# Patient Record
Sex: Female | Born: 1995 | Race: Black or African American | Hispanic: No | Marital: Single | Smoking: Never smoker
Health system: Southern US, Community
[De-identification: ages and names within clinical notes are randomized; demographics above are authoritative.]

## PROBLEM LIST (undated history)

## (undated) DIAGNOSIS — K5090 Crohn's disease, unspecified, without complications: Secondary | ICD-10-CM

## (undated) HISTORY — PX: COLONOSCOPY: SHX174

---
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
## 2016-04-21 ENCOUNTER — Ambulatory Visit (INDEPENDENT_AMBULATORY_CARE_PROVIDER_SITE_OTHER): Payer: Managed Care, Other (non HMO) | Admitting: Student

## 2016-04-21 DIAGNOSIS — Z111 Encounter for screening for respiratory tuberculosis: Secondary | ICD-10-CM | POA: Diagnosis not present

## 2016-04-21 NOTE — Progress Notes (Addendum)
Subjective:    Patient ID: Ann Avery, female    DOB: 1995-06-29, 21 y.o.   MRN: [id number]  HPI   Tuberculosis Risk Questionnaire  1. No Were you born outside the [place] in one of the following parts of the world: [place], [place], [place], [place] or [place]?    2. No Have you traveled outside the [place] and lived for more than one month in one of the following parts of the world: [place], [place], [place], [place] or [place]?    3. Yes on pentasa for Crohn's Do you have a compromised immune system such as from any of the following conditions:HIV/AIDS, organ or bone marrow transplantation, diabetes, immunosuppressive medicines (e.g. Prednisone, Remicaide), leukemia, lymphoma, cancer of the head or neck, gastrectomy or jejunal bypass, end-stage renal disease (on dialysis), or silicosis?     4. No Have you ever or do you plan on working in: a residential care center, a health care facility, a jail or prison or homeless shelter?    5. No Have you ever: injected illegal drugs, used crack cocaine, lived in a homeless shelter  or been in jail or prison?     6. No Have you ever been exposed to anyone with infectious tuberculosis?    Tuberculosis Symptom Questionnaire  Do you currently have any of the following symptoms?  1. No Unexplained cough lasting more than 3 weeks?   2. No Unexplained fever lasting more than 3 weeks.   3. No Night Sweats (sweating that leaves the bedclothes and sheets wet)     4. No Shortness of Breath   5. No Chest Pain   6. No Unintentional weight loss    7. No Unexplained fatigue (very tired for no reason)    PMHx - Updated and reviewed.  Contributory factors include: Crohn's PSHx - Updated and reviewed.  Contributory factors include:  Negative FHx - Updated and reviewed.  Contributory factors include:  Negative Social Hx - Updated and reviewed. Contributory factors include: Negative Medications - reviewed     Review of Systems  Constitutional: Negative for chills, fatigue and fever.  HENT: Negative for congestion and rhinorrhea.   Respiratory: Negative for cough, chest tightness and shortness of breath.   Cardiovascular: Negative for chest pain and leg swelling.  Gastrointestinal: Negative for abdominal pain and nausea.  Genitourinary: Negative for dysuria and urgency.  Musculoskeletal: Negative for arthralgias and joint swelling.  Skin: Negative for rash and wound.  Psychiatric/Behavioral: Negative for agitation and confusion.  All other systems reviewed and are negative.      Objective:   Physical Exam  Constitutional: She is oriented to person, place, and time. She appears well-developed and well-nourished. No distress.  HENT:  Head: Normocephalic and atraumatic.  Right Ear: External ear normal.  Left Ear: External ear normal.  Neck: Normal range of motion. Neck supple.  Pulmonary/Chest: Effort normal. No respiratory distress.  Musculoskeletal: Normal range of motion. She exhibits no edema.  Neurological: She is alert and oriented to person, place, and time.  Skin: Skin is warm. No rash noted. She is not diaphoretic. No erythema.  Psychiatric: She has a normal mood and affect. Her behavior is normal. Judgment and thought content normal.  Nursing note and vitals reviewed.   BP 110/80 (BP Location: Right Arm, Patient Position: Sitting, Cuff Size: Small)   Pulse 97   Temp 97.7 F (36.5 C) (Oral)   Resp 16   Ht 5' 7"  (1.702 m)  Wt 119 lb (54 kg)   LMP 03/17/2016   SpO2 100%   BMI 18.64 kg/m        Assessment & Plan:  Screening-pulmonary TB Has no risk factors, besides some immunosuppression.  PPD placed, recheck in 48-72 hours.  Signed,  Balinda Quails, DO [facility] 6:37 PM

## 2016-04-21 NOTE — Assessment & Plan Note (Addendum)
Has no risk factors, besides some immunosuppression.  PPD placed, recheck in 48-72 hours.

## 2016-04-21 NOTE — Patient Instructions (Addendum)
Please come 48-72 hours after placement of PPD.  That is Thursday evening or Friday during day.    IF you received an x-ray today, you will receive an invoice from [facility]. Please contact [facility] at [phone] with questions or concerns regarding your invoice.   IF you received labwork today, you will receive an invoice from [facility]. Please contact [facility] at [phone] with questions or concerns regarding your invoice.   Our billing staff will not be able to assist you with questions regarding bills from these companies.  You will be contacted with the lab results as soon as they are available. The fastest way to get your results is to activate your My Chart account. Instructions are located on the last page of this paperwork. If you have not heard from [place] regarding the results in 2 weeks, please contact this office.

## 2016-04-24 ENCOUNTER — Ambulatory Visit: Payer: Managed Care, Other (non HMO)

## 2016-04-24 DIAGNOSIS — Z111 Encounter for screening for respiratory tuberculosis: Secondary | ICD-10-CM

## 2016-04-24 LAB — TB SKIN TEST
Induration: 0 mm
TB Skin Test: NEGATIVE

## 2016-04-24 NOTE — Progress Notes (Unsigned)
PPd Read on left arm.

## 2017-02-23 IMAGING — CT CT ABD-PELV W/ CM
2 of 4 series · 5 of 46 positions shown, 7 images · IV contrast (omnipaque)
Comparison: None.

CLINICAL DATA: Right lower quadrant pain starting yesterday.
Previous history of Crohn's disease in remission for many years.

EXAM:
CT ABDOMEN AND PELVIS WITH CONTRAST
TECHNIQUE: Multidetector CT imaging of the abdomen and pelvis was performed
using the standard protocol following bolus administration of
intravenous contrast.
CONTRAST:  80mL OMNIPAQUE IOHEXOL 300 MG/ML  SOLN

[Series 204: cor · coronal · 0.45mm/px · 4 of 87 slices shown, 5 images]
[im 20/87  soft-tissue]
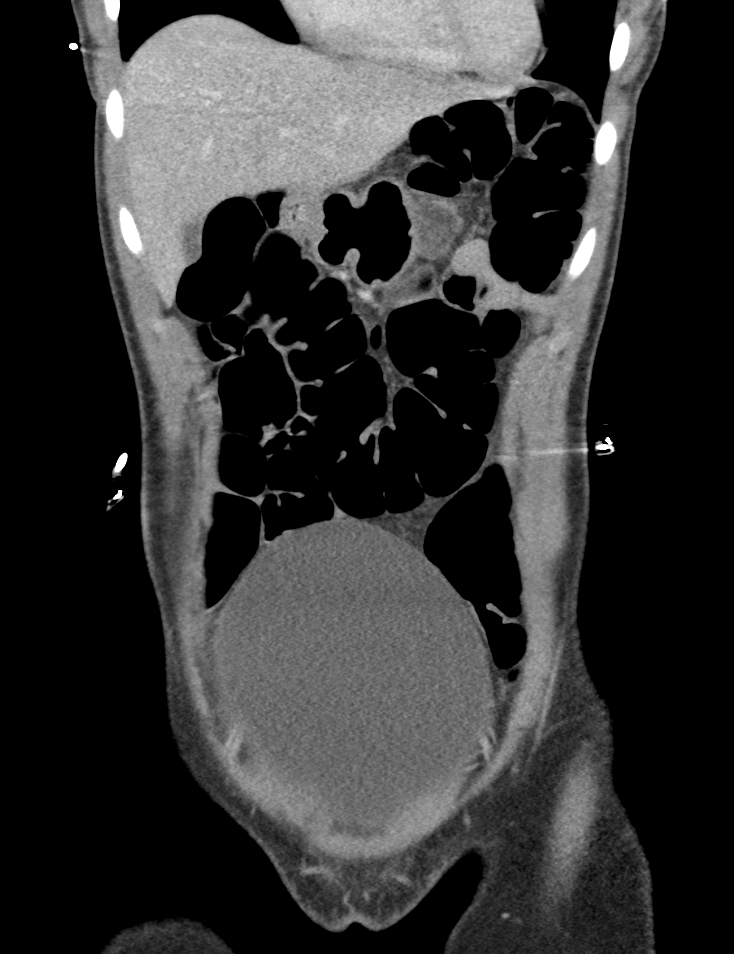
[im 20/87  bone]
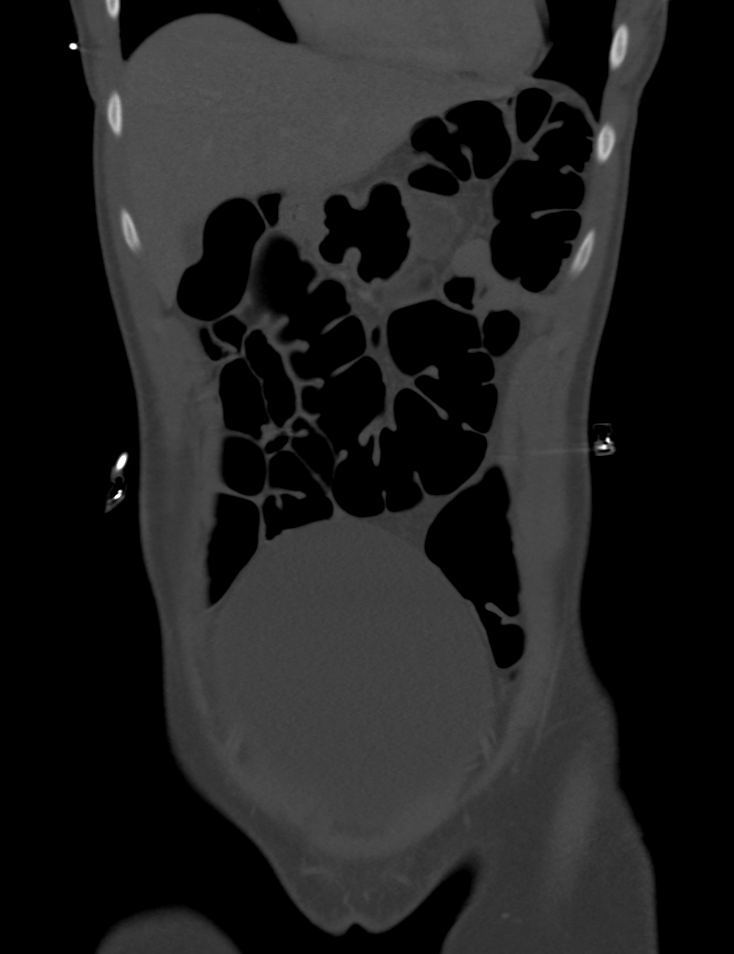
[im 39/87  soft-tissue]
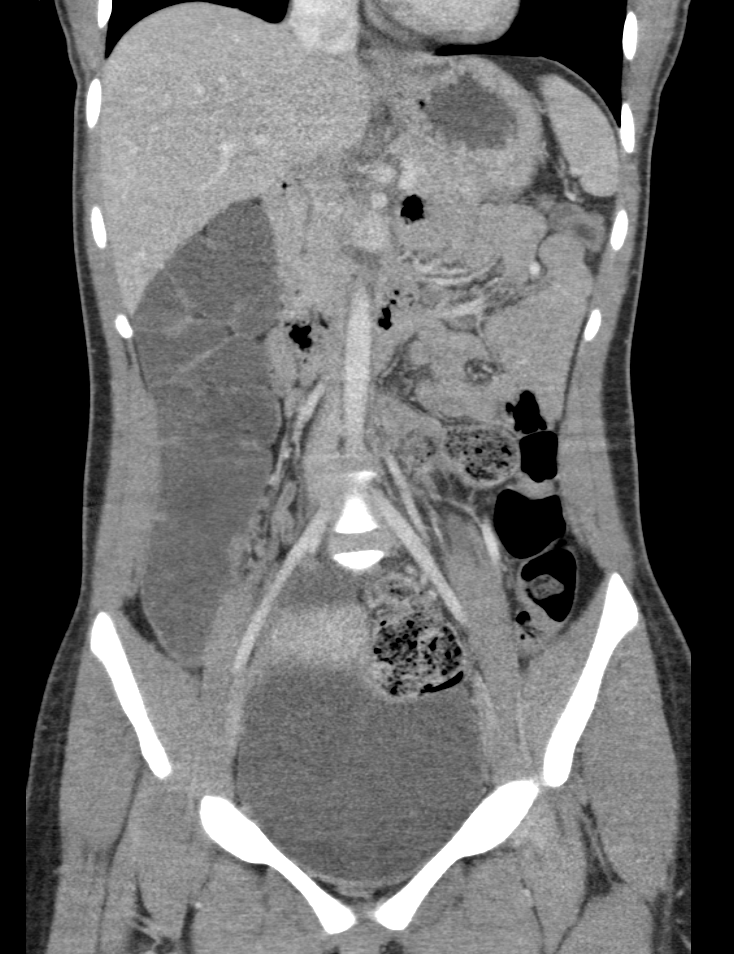
[im 58/87  soft-tissue]
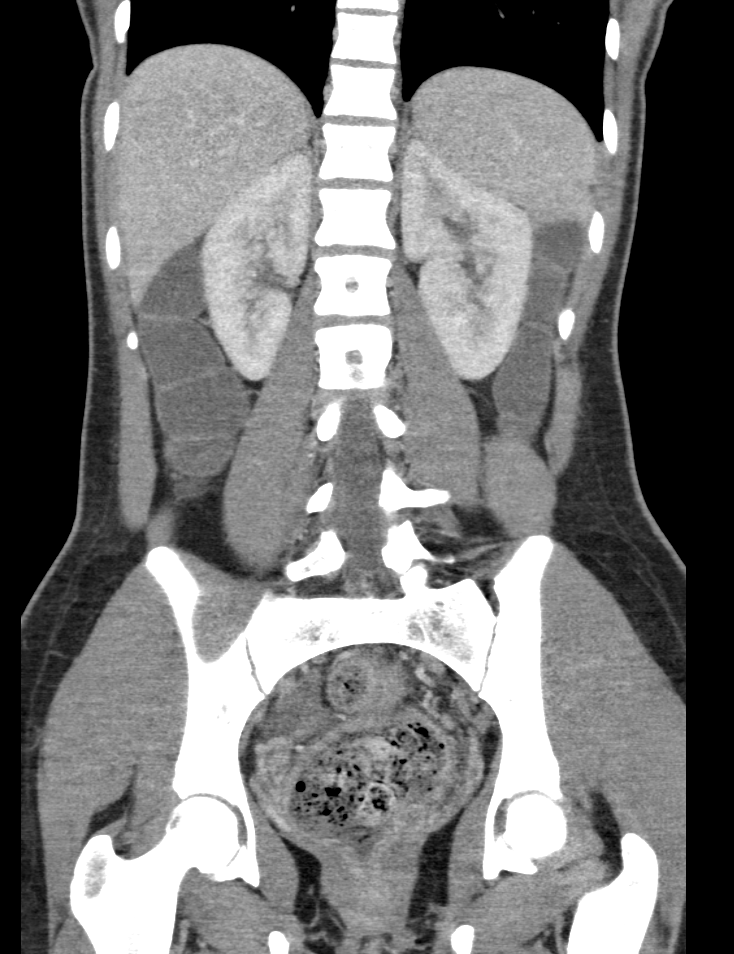
[im 77/87  soft-tissue]
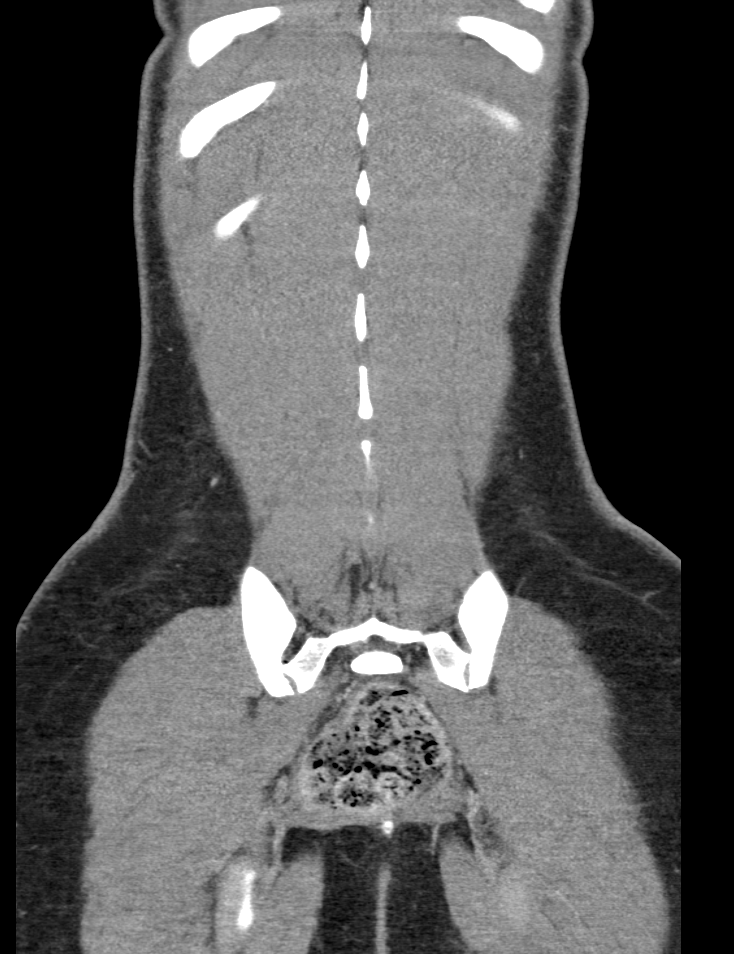

[Series 205: sag · sagittal · 0.45mm/px · 1 of 132 slices shown, 2 images]
[im 44/132  soft-tissue]
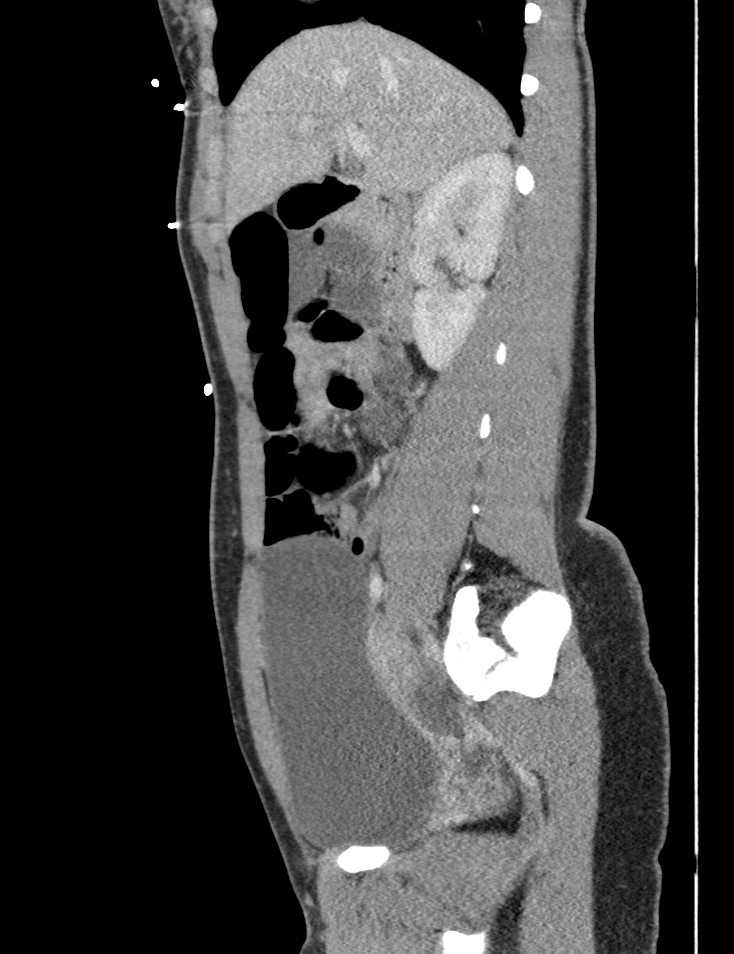
[im 44/132  bone]
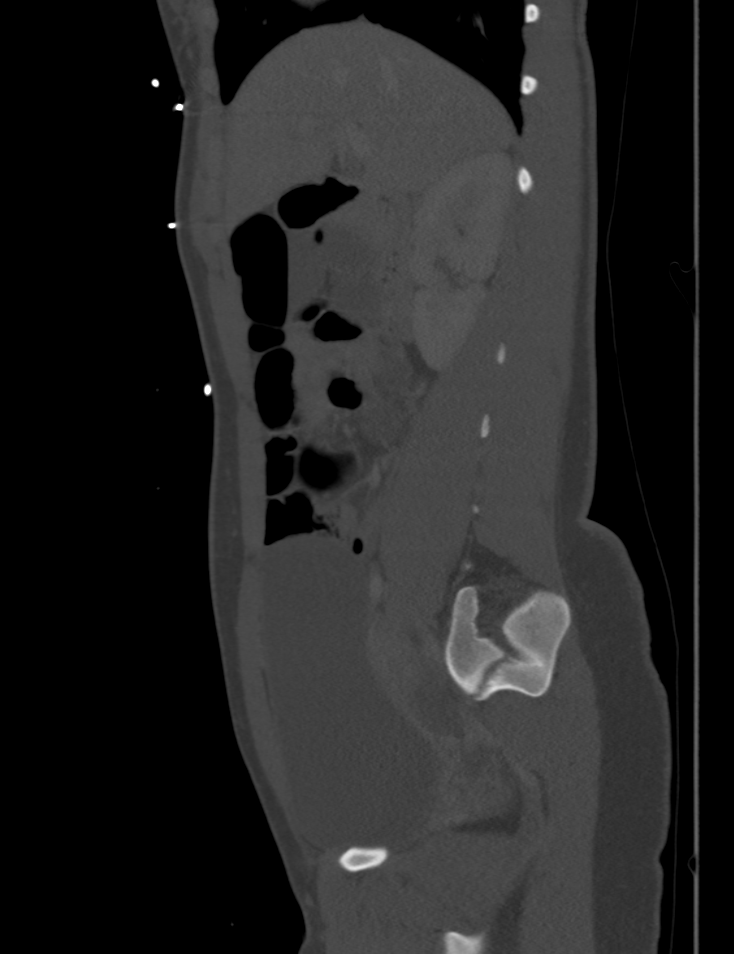

[5 of 46 positions shown; findings below may reference images not displayed]

FINDINGS: The lung bases are clear.

The liver, spleen, gallbladder, pancreas, adrenal glands, kidneys,
abdominal aorta, inferior vena cava, and retroperitoneal lymph nodes
are unremarkable.

Stomach and small bowel are decompressed. Stool-filled rectosigmoid
colon with remainder the colon being gas-filled. Air-fluid levels
are present throughout the colon. No colonic wall thickening.
Changes are nonspecific but suggest infectious colitis. No specific
small bowel wall thickening to suggest evidence of Crohn's disease
although decompression of small bowel limits evaluation. No free air
or free fluid in the abdomen.

Pelvis: The appendix is normal. The bladder is prominently distended
but without wall thickening or stone. Uterus is not enlarged. No
pelvic mass or lymphadenopathy. No free or loculated pelvic fluid
collections. No destructive bone lesions.
IMPRESSION: Multiple air-fluid levels in the colon suggesting colitis. No
specific changes of Crohn's disease. Bladder is diffusely distended
with urine. No bladder wall thickening.

## 2017-03-02 DIAGNOSIS — F909 Attention-deficit hyperactivity disorder, unspecified type: Secondary | ICD-10-CM | POA: Insufficient documentation

## 2017-04-23 ENCOUNTER — Encounter (HOSPITAL_COMMUNITY): Payer: Self-pay | Admitting: Family Medicine

## 2017-04-23 ENCOUNTER — Ambulatory Visit (HOSPITAL_COMMUNITY)
Admission: EM | Admit: 2017-04-23 | Discharge: 2017-04-23 | Disposition: A | Discharge status: Home / Self Care | Payer: Managed Care, Other (non HMO) | Attending: Internal Medicine | Admitting: Internal Medicine

## 2017-04-23 DIAGNOSIS — J00 Acute nasopharyngitis [common cold]: Secondary | ICD-10-CM

## 2017-04-23 DIAGNOSIS — J029 Acute pharyngitis, unspecified: Secondary | ICD-10-CM

## 2017-04-23 DIAGNOSIS — R509 Fever, unspecified: Secondary | ICD-10-CM | POA: Diagnosis not present

## 2017-04-23 LAB — POCT RAPID STREP A: Streptococcus, Group A Screen (Direct): NEGATIVE

## 2017-04-23 MED ORDER — BENZONATATE 100 MG PO CAPS: 100.0000 mg | ORAL_CAPSULE | Freq: Three times a day (TID) | ORAL | 0 refills | Status: DC

## 2017-04-23 MED ORDER — FLUTICASONE PROPIONATE 50 MCG/ACT NA SUSP: 2.0000 | Freq: Every day | NASAL | 0 refills | Status: DC

## 2017-04-23 MED ORDER — IPRATROPIUM BROMIDE 0.06 % NA SOLN: 2.0000 | Freq: Four times a day (QID) | NASAL | 0 refills | Status: DC

## 2017-04-23 MED ORDER — PHENOL 1.4 % MT LIQD: 1.0000 | OROMUCOSAL | 0 refills | Status: DC | PRN

## 2017-04-23 NOTE — ED Triage Notes (Signed)
Pt here for fever, sore throat since yesterday. Drinking tea that doesn't help. Reports recent case of the flu.

## 2017-04-23 NOTE — ED Provider Notes (Signed)
[facility]    CSN: [id number] Arrival date & time: 04/23/17  1851     History   Chief Complaint Chief Complaint  Patient presents with  . Fever  . Sore Throat    HPI Ann Avery is a 22 y.o. female.   22 year old female comes in for a 2 day history of URI symptoms.  Has had sore throat, nasal congestion, productive cough, fever. Tmax 102, has not taken anything for it. Eating and drinking without problems. Never smoker. Had flu like symptoms 2 weeks ago that completely resolved prior to current symptom onset.      Past Medical History:  Diagnosis Date  . Crohn disease ([place])   . Ulcerative colitis     Patient Active Problem List   Diagnosis Date Noted  . Screening-pulmonary TB 04/21/2016    Past Surgical History:  Procedure Laterality Date  . COLONOSCOPY      OB History    No data available       Home Medications    Prior to Admission medications   Medication Sig Start Date End Date Taking? Authorizing Provider  benzonatate (TESSALON) 100 MG capsule Take 1 capsule (100 mg total) by mouth every 8 (eight) hours. 04/23/17   Tasia Catchings, Danaka Llera V, PA-C  fluticasone (FLONASE) 50 MCG/ACT nasal spray Place 2 sprays into both nostrils daily. 04/23/17   Tasia Catchings, Jovontae Banko V, PA-C  guanFACINE (INTUNIV) 4 MG TB24 SR tablet Take 4 mg by mouth daily.    [provider]  ipratropium (ATROVENT) 0.06 % nasal spray Place 2 sprays into both nostrils 4 (four) times daily. 04/23/17   Ok Edwards, PA-C  levonorgestrel-ethinyl estradiol (AVIANE,ALESSE,LESSINA) 0.1-20 MG-MCG tablet Take 1 tablet by mouth daily.    [provider]  mesalamine (PENTASA) 500 MG CR capsule Take 500 mg by mouth 2 (two) times daily.     [provider]  methylphenidate (CONCERTA) 36 MG CR tablet Take 36 mg by mouth every morning.    [provider]  phenol (CHLORASEPTIC) 1.4 % LIQD Use as directed 1 spray in the mouth or throat as needed for throat irritation / pain. 04/23/17   Ok Edwards,  PA-C    Family History Family History  Problem Relation Age of Onset  . Hypertension Mother   . Diabetes Father     Social History Social History   Tobacco Use  . Smoking status: Never Smoker  . Smokeless tobacco: Never Used  Substance Use Topics  . Alcohol use: No  . Drug use: No     Allergies   Latex and Shellfish allergy   Review of Systems Review of Systems  Reason unable to perform ROS: See HPI as above.     Physical Exam Triage Vital Signs ED Triage Vitals [04/23/17 1929]  Enc Vitals Group     BP 111/63     Pulse Rate (!) 104     Resp 18     Temp 99 F (37.2 C)     Temp src      SpO2 100 %     Weight      Height      Head Circumference      Peak Flow      Pain Score 7     Pain Loc      Pain Edu?      Excl. in [place]?    No data found.  Updated Vital Signs BP 111/63   Pulse (!) 104   Temp  99 F (37.2 C)   Resp 18   LMP 04/07/2017   SpO2 100%   Physical Exam  Constitutional: She is oriented to person, place, and time. She appears well-developed and well-nourished. No distress.  HENT:  Head: Normocephalic and atraumatic.  Right Ear: External ear and ear canal normal.  Left Ear: External ear and ear canal normal.  Nose: Mucosal edema and rhinorrhea present. Right sinus exhibits no maxillary sinus tenderness and no frontal sinus tenderness. Left sinus exhibits no maxillary sinus tenderness and no frontal sinus tenderness.  Mouth/Throat: Uvula is midline and mucous membranes are normal. Posterior oropharyngeal erythema present. No tonsillar exudate.  Bilateral cerumen impaction. TM not visible.   Eyes: Conjunctivae are normal. Pupils are equal, round, and reactive to light.  Neck: Normal range of motion. Neck supple.  Cardiovascular: Normal rate, regular rhythm and normal heart sounds. Exam reveals no gallop and no friction rub.  No murmur heard. Pulmonary/Chest: Effort normal and breath sounds normal. She has no decreased breath sounds. She has  no wheezes. She has no rhonchi. She has no rales.  Lymphadenopathy:    She has no cervical adenopathy.  Neurological: She is alert and oriented to person, place, and time.  Skin: Skin is warm and dry.  Psychiatric: She has a normal mood and affect. Her behavior is normal. Judgment normal.    UC Treatments / Results  Labs (all labs ordered are listed, but only abnormal results are displayed) Labs Reviewed  CULTURE, GROUP A STREP [facility])  POCT RAPID STREP A    EKG  EKG Interpretation None       Radiology No results found.  Procedures Procedures (including critical care time)  Medications Ordered in UC Medications - No data to display   Initial Impression / Assessment and Plan / UC Course  I have reviewed the triage vital signs and the nursing notes.  Pertinent labs & imaging results that were available during my care of the patient were reviewed by me and considered in my medical decision making (see chart for details).    Rapid strep negative. Symptomatic treatment as needed. Return precautions given.   Final Clinical Impressions(s) / UC Diagnoses   Final diagnoses:  Acute nasopharyngitis    ED Discharge Orders        Ordered    fluticasone (FLONASE) 50 MCG/ACT nasal spray  Daily     04/23/17 2029    ipratropium (ATROVENT) 0.06 % nasal spray  4 times daily     04/23/17 2029    benzonatate (TESSALON) 100 MG capsule  Every 8 hours     04/23/17 2029    phenol (CHLORASEPTIC) 1.4 % LIQD  As needed     04/23/17 2029        Ok Edwards, PA-C 04/23/17 2036

## 2017-04-23 NOTE — Discharge Instructions (Signed)
Rapid strep negative. Tessalon for cough. Start flonase, atrovent nasal spray, zyrtec-D for nasal congestion/drainage. You can use over the counter nasal saline rinse such as neti pot for nasal congestion. Keep hydrated, your urine should be clear to pale yellow in color. Tylenol/motrin for fever and pain. Monitor for any worsening of symptoms, chest pain, shortness of breath, wheezing, swelling of the throat, follow up for reevaluation.   For sore throat try using a honey-based tea. Use 3 teaspoons of honey with juice squeezed from half lemon. Place shaved pieces of ginger into 1/2-1 cup of water and warm over stove top. Then mix the ingredients and repeat every 4 hours as needed.

## 2017-04-26 LAB — CULTURE, GROUP A STREP (THRC)

## 2017-05-23 ENCOUNTER — Encounter (HOSPITAL_COMMUNITY): Payer: Self-pay | Admitting: Emergency Medicine

## 2017-05-23 ENCOUNTER — Other Ambulatory Visit: Payer: Self-pay

## 2017-05-23 DIAGNOSIS — K5090 Crohn's disease, unspecified, without complications: Secondary | ICD-10-CM | POA: Insufficient documentation

## 2017-05-23 DIAGNOSIS — Z91040 Latex allergy status: Secondary | ICD-10-CM | POA: Insufficient documentation

## 2017-05-23 DIAGNOSIS — Z79899 Other long term (current) drug therapy: Secondary | ICD-10-CM | POA: Diagnosis not present

## 2017-05-23 DIAGNOSIS — R197 Diarrhea, unspecified: Secondary | ICD-10-CM | POA: Diagnosis present

## 2017-05-23 LAB — CBC
HEMATOCRIT: 37.2 % (ref 36.0–46.0)
HEMOGLOBIN: 12.1 g/dL (ref 12.0–15.0)
MCH: 29.2 pg (ref 26.0–34.0)
MCHC: 32.5 g/dL (ref 30.0–36.0)
MCV: 89.6 fL (ref 78.0–100.0)
Platelets: 358 10*3/uL (ref 150–400)
RBC: 4.15 MIL/uL (ref 3.87–5.11)
RDW: 13.9 % (ref 11.5–15.5)
WBC: 5.8 10*3/uL (ref 4.0–10.5)

## 2017-05-23 LAB — COMPREHENSIVE METABOLIC PANEL
ALT: 13 U/L — ABNORMAL LOW (ref 14–54)
ANION GAP: 11 (ref 5–15)
AST: 23 U/L (ref 15–41)
Albumin: 3.8 g/dL (ref 3.5–5.0)
Alkaline Phosphatase: 61 U/L (ref 38–126)
BILIRUBIN TOTAL: 0.6 mg/dL (ref 0.3–1.2)
BUN: 6 mg/dL (ref 6–20)
CHLORIDE: 105 mmol/L (ref 101–111)
CO2: 20 mmol/L — ABNORMAL LOW (ref 22–32)
Calcium: 8.9 mg/dL (ref 8.9–10.3)
Creatinine, Ser: 0.89 mg/dL (ref 0.44–1.00)
Glucose, Bld: 90 mg/dL (ref 65–99)
POTASSIUM: 3.3 mmol/L — AB (ref 3.5–5.1)
Sodium: 136 mmol/L (ref 135–145)
TOTAL PROTEIN: 7.8 g/dL (ref 6.5–8.1)

## 2017-05-23 LAB — LIPASE, BLOOD: LIPASE: 32 U/L (ref 11–51)

## 2017-05-23 LAB — URINALYSIS, ROUTINE W REFLEX MICROSCOPIC
BILIRUBIN URINE: NEGATIVE
Glucose, UA: NEGATIVE mg/dL
HGB URINE DIPSTICK: NEGATIVE
KETONES UR: 20 mg/dL — AB
LEUKOCYTES UA: NEGATIVE
NITRITE: NEGATIVE
Protein, ur: 30 mg/dL — AB
SPECIFIC GRAVITY, URINE: 1.028 (ref 1.005–1.030)
pH: 5 (ref 5.0–8.0)

## 2017-05-23 LAB — I-STAT BETA HCG BLOOD, ED (MC, WL, AP ONLY)

## 2017-05-23 NOTE — ED Triage Notes (Signed)
Pt presents with chrons flare this morning with increased diarrhea and weight loss and decreased appetite; pt denies bloody stools

## 2017-05-24 ENCOUNTER — Emergency Department (HOSPITAL_COMMUNITY)
Admission: EM | Admit: 2017-05-24 | Discharge: 2017-05-24 | Disposition: A | Discharge status: Home / Self Care | Payer: Managed Care, Other (non HMO) | Attending: Emergency Medicine | Admitting: Emergency Medicine

## 2017-05-24 DIAGNOSIS — K5010 Crohn's disease of large intestine without complications: Secondary | ICD-10-CM

## 2017-05-24 DIAGNOSIS — R197 Diarrhea, unspecified: Secondary | ICD-10-CM

## 2017-05-24 DIAGNOSIS — K5090 Crohn's disease, unspecified, without complications: Secondary | ICD-10-CM | POA: Diagnosis not present

## 2017-05-24 MED ORDER — ONDANSETRON 4 MG PO TBDP
4.0000 mg | ORAL_TABLET | Freq: Once | ORAL | Status: AC | PRN
  Administered 2017-05-24: 4 mg via ORAL
  Filled 2017-05-24: qty 1

## 2017-05-24 MED ORDER — SODIUM CHLORIDE 0.9 % IV BOLUS
2000.0000 mL | Freq: Once | INTRAVENOUS | Status: AC
  Administered 2017-05-24: 2000 mL via INTRAVENOUS

## 2017-05-24 MED ORDER — METHYLPREDNISOLONE SODIUM SUCC 125 MG IJ SOLR
125.0000 mg | Freq: Once | INTRAMUSCULAR | Status: AC
  Administered 2017-05-24: 125 mg via INTRAVENOUS
  Filled 2017-05-24: qty 2

## 2017-05-24 MED ORDER — PREDNISONE 10 MG PO TABS: ORAL_TABLET | ORAL | 0 refills | Status: DC

## 2017-05-24 MED ORDER — ONDANSETRON 4 MG PO TBDP: 4.0000 mg | ORAL_TABLET | Freq: Three times a day (TID) | ORAL | 0 refills | Status: DC | PRN

## 2017-05-24 NOTE — ED Notes (Signed)
Pt ambulated without assistance around POD C. No c/o of dizziness or light headedness

## 2017-05-24 NOTE — ED Provider Notes (Signed)
[facility] EMERGENCY DEPARTMENT Provider Note   CSN: [id number] Arrival date & time: 05/23/17  2114     History   Chief Complaint Chief Complaint  Patient presents with  . Diarrhea  . Weight Loss    HPI Ann Avery is a 22 y.o. female.  Patient with a history of Crohn's Disease, followed by Dr. Watt Climes, having symptoms of flare including 1 day of nonbloody diarrhea, diffuse abdominal cramping without fever. She reports nausea with only one episode vomiting. Nausea is resolved with zofran given after arrival. She reports the last Crohn's flare was about 2 years ago. No urinary symptoms, cough, SOB, URI symptoms.    The history is provided by the patient and a parent. No language interpreter was used.  Diarrhea   Associated symptoms include abdominal pain and vomiting. Pertinent negatives include no chills and no myalgias.    Past Medical History:  Diagnosis Date  . Crohn disease ([place])   . Ulcerative colitis     Patient Active Problem List   Diagnosis Date Noted  . Screening-pulmonary TB 04/21/2016    Past Surgical History:  Procedure Laterality Date  . COLONOSCOPY       OB History   None      Home Medications    Prior to Admission medications   Medication Sig Start Date End Date Taking? Authorizing Provider  benzonatate (TESSALON) 100 MG capsule Take 1 capsule (100 mg total) by mouth every 8 (eight) hours. 04/23/17   Tasia Catchings, Amy V, PA-C  fluticasone (FLONASE) 50 MCG/ACT nasal spray Place 2 sprays into both nostrils daily. 04/23/17   Tasia Catchings, Amy V, PA-C  guanFACINE (INTUNIV) 4 MG TB24 SR tablet Take 4 mg by mouth daily.    [provider]  ipratropium (ATROVENT) 0.06 % nasal spray Place 2 sprays into both nostrils 4 (four) times daily. 04/23/17   Ok Edwards, PA-C  levonorgestrel-ethinyl estradiol (AVIANE,ALESSE,LESSINA) 0.1-20 MG-MCG tablet Take 1 tablet by mouth daily.    [provider]  mesalamine (PENTASA) 500 MG CR capsule Take 500  mg by mouth 2 (two) times daily.     [provider]  methylphenidate (CONCERTA) 36 MG CR tablet Take 36 mg by mouth every morning.    [provider]  phenol (CHLORASEPTIC) 1.4 % LIQD Use as directed 1 spray in the mouth or throat as needed for throat irritation / pain. 04/23/17   Ok Edwards, PA-C    Family History Family History  Problem Relation Age of Onset  . Hypertension Mother   . Diabetes Father     Social History Social History   Tobacco Use  . Smoking status: Never Smoker  . Smokeless tobacco: Never Used  Substance Use Topics  . Alcohol use: No  . Drug use: No     Allergies   Latex and Shellfish allergy   Review of Systems Review of Systems  Constitutional: Negative for chills and fever.  HENT: Negative.   Respiratory: Negative.   Cardiovascular: Negative.   Gastrointestinal: Positive for abdominal pain, diarrhea, nausea and vomiting.  Musculoskeletal: Negative.  Negative for myalgias.  Skin: Negative.   Neurological: Negative.  Negative for syncope and weakness.     Physical Exam Updated Vital Signs BP 104/62 (BP Location: Right Arm)   Pulse (!) 111   Temp 98.5 F (36.9 C) (Oral)   Resp 18   Ht 5' 7"  (1.702 m)   Wt 53.5 kg (118 lb)   LMP 05/03/2017   SpO2  98%   BMI 18.48 kg/m   Physical Exam  Constitutional: She is oriented to person, place, and time. She appears well-developed and well-nourished.  HENT:  Head: Normocephalic.  Neck: Normal range of motion. Neck supple.  Cardiovascular: Normal rate and regular rhythm.  Pulmonary/Chest: Effort normal and breath sounds normal. She has no wheezes. She has no rales.  Abdominal: Soft. Bowel sounds are normal. She exhibits no distension. There is tenderness (Diffuse). There is no rebound and no guarding.  Musculoskeletal: Normal range of motion.  Neurological: She is alert and oriented to person, place, and time.  Skin: Skin is warm and dry. No rash noted.  Psychiatric: She has a  normal mood and affect.     ED Treatments / Results  Labs (all labs ordered are listed, but only abnormal results are displayed) Labs Reviewed  COMPREHENSIVE METABOLIC PANEL - Abnormal; Notable for the following components:      Result Value   Potassium 3.3 (*)    CO2 20 (*)    ALT 13 (*)    All other components within normal limits  URINALYSIS, ROUTINE W REFLEX MICROSCOPIC - Abnormal; Notable for the following components:   APPearance HAZY (*)    Ketones, ur 20 (*)    Protein, ur 30 (*)    Bacteria, UA RARE (*)    Squamous Epithelial / LPF 6-30 (*)    All other components within normal limits  LIPASE, BLOOD  CBC  I-STAT BETA HCG BLOOD, ED ([facility], [facility], [facility] ONLY)    EKG None  Radiology No results found.  Procedures Procedures (including critical care time)  Medications Ordered in ED Medications  ondansetron (ZOFRAN-ODT) disintegrating tablet 4 mg (4 mg Oral Given 05/24/17 0214)     Initial Impression / Assessment and Plan / ED Course  I have reviewed the triage vital signs and the nursing notes.  Pertinent labs & imaging results that were available during my care of the patient were reviewed by me and considered in my medical decision making (see chart for details).     Patient with h/o Crohn's with symptoms familiar to her as a flare. Followed by Dr. Watt Climes. No fever.   She is receiving IVF's and has gotten Zofran. She is feeling better over time. Discussed steroids given some question of not using them in the past and patient reports she feels they would be of benefit based on how she feels. IV Solumedrol provided.   Plan: anticipate discharge home. Will get rehydrated, start on prednisone, give Zofran, f/u with [facility].   Patient care signed out to [facility], PA-C, pending re-evaluation.   Final Clinical Impressions(s) / ED Diagnoses   Final diagnoses:  None   1. Diarrhea 2. H/o Crohn's Disease  ED Discharge Orders    None       Charlann Lange,  PA-C 05/25/17 0356    Veryl Speak, MD 05/25/17 [phone]

## 2017-05-24 NOTE — ED Notes (Signed)
Updated on wait for treatment room.

## 2017-05-24 NOTE — ED Provider Notes (Signed)
Care assumed from Ann Park PA-C at shift change, but in brief Ann Avery is a 22 y.o. female with Hx of Crohn's, typical flare symptoms, nonbloody diarrhea, diffuse abdominal pain, migrating, mild nausea. Lab work is reassuring, and abdominal exam non indicative of acute surgical abdomen and pt and family would like to avoid imaging.    Plan: Re-eval after 2nd fluid bolus, ambulate, if feeling better, D/C with steroids, zofran and follow up with Dr. Watt Climes.  7:50 AM Pt reports she is feeling better, tolerating PO here in ED and ambulatory without difficulty, pt stable for discharge home with Zofran and course of steroids. She will follow up outpatient with GI this week. Return precautions discussed.    Ann Larsen, PA-C 05/24/17 Ann Avery    Ann Muskrat, MD 05/24/17 [phone]

## 2017-05-24 NOTE — Discharge Instructions (Addendum)
Call Dr. Watt Climes later this morning to schedule a recheck this week. Return here with any high fever, severe pain, bloody diarrhea, uncontrolled vomiting, or for new concerns.

## 2018-04-19 DIAGNOSIS — F909 Attention-deficit hyperactivity disorder, unspecified type: Secondary | ICD-10-CM | POA: Insufficient documentation

## 2018-04-19 DIAGNOSIS — F900 Attention-deficit hyperactivity disorder, predominantly inattentive type: Secondary | ICD-10-CM | POA: Insufficient documentation

## 2018-04-20 ENCOUNTER — Ambulatory Visit
Admission: EM | Admit: 2018-04-20 | Discharge: 2018-04-20 | Disposition: A | Discharge status: Home / Self Care | Payer: Managed Care, Other (non HMO) | Attending: Emergency Medicine | Admitting: Emergency Medicine

## 2018-04-20 DIAGNOSIS — J069 Acute upper respiratory infection, unspecified: Secondary | ICD-10-CM

## 2018-04-20 MED ORDER — BENZONATATE 100 MG PO CAPS: 100.0000 mg | ORAL_CAPSULE | Freq: Three times a day (TID) | ORAL | 0 refills | Status: DC | PRN

## 2018-04-20 MED ORDER — SALINE SPRAY 0.65 % NA SOLN: 1.0000 | NASAL | 0 refills | Status: DC | PRN

## 2018-04-20 MED ORDER — GUAIFENESIN ER 600 MG PO TB12: 1200.0000 mg | ORAL_TABLET | Freq: Two times a day (BID) | ORAL | 0 refills | Status: AC | PRN

## 2018-04-20 NOTE — Discharge Instructions (Signed)
Push fluids to ensure adequate hydration and keep secretions thin.  Tylenol and/or ibuprofen as needed for pain or fevers.  Tessalon as needed for cough.  Mucinex to loosen secretions.  Saline nasal spray to help prevent nose bleeds.  If symptoms worsen or do not improve in the next week to return to be seen or to follow up with your PCP.

## 2018-04-20 NOTE — ED Triage Notes (Signed)
Pt c/o cough, fever, runny nose and body aches x2 days

## 2018-04-20 NOTE — ED Provider Notes (Signed)
[facility]    CSN: [id number] Arrival date & time: 04/20/18  1635     History   Chief Complaint Chief Complaint  Patient presents with  . Influenza    HPI Ann Avery is a 23 y.o. female.   Ann Avery presents with complaints of cough, congestion, body aches, tmax of 100.2, which started two days ago. States had nose bleed this morning. No sore throat, no ear pain. Didn't get a flu vaccine this season. No rash. No gi/gu complaints. No chest pain , no shortness of breath. Took tylenol cold/flu this am which didn't help. Hasn't taken anything since. No gi/gu complaints. Hx of crohns, UC.    ROS per HPI.      Past Medical History:  Diagnosis Date  . Crohn disease ([place])   . Ulcerative colitis     Patient Active Problem List   Diagnosis Date Noted  . Screening-pulmonary TB 04/21/2016    Past Surgical History:  Procedure Laterality Date  . COLONOSCOPY      OB History   No obstetric history on file.      Home Medications    Prior to Admission medications   Medication Sig Start Date End Date Taking? Authorizing Provider  benzonatate (TESSALON) 100 MG capsule Take 1 capsule (100 mg total) by mouth 3 (three) times daily as needed for cough. 04/20/18   Zigmund Gottron, NP  guaiFENesin (MUCINEX) 600 MG 12 hr tablet Take 2 tablets (1,200 mg total) by mouth 2 (two) times daily as needed for up to 5 days. 04/20/18 04/25/18  Zigmund Gottron, NP  guanFACINE (INTUNIV) 4 MG TB24 SR tablet Take 4 mg by mouth daily.    [provider]  levonorgestrel-ethinyl estradiol (AVIANE,ALESSE,LESSINA) 0.1-20 MG-MCG tablet Take 1 tablet by mouth daily.    [provider]  mesalamine (PENTASA) 500 MG CR capsule Take 500 mg by mouth 2 (two) times daily.     [provider]  methylphenidate (CONCERTA) 36 MG CR tablet Take 36 mg by mouth every morning.    [provider]  ondansetron (ZOFRAN ODT) 4 MG disintegrating tablet Take 1 tablet (4 mg  total) by mouth every 8 (eight) hours as needed for nausea or vomiting. 05/24/17   Charlann Lange, PA-C  phenol (CHLORASEPTIC) 1.4 % LIQD Use as directed 1 spray in the mouth or throat as needed for throat irritation / pain. 04/23/17   Tasia Catchings, Amy V, PA-C  sodium chloride (OCEAN) 0.65 % SOLN nasal spray Place 1 spray into both nostrils as needed for congestion. 04/20/18   Zigmund Gottron, NP    Family History Family History  Problem Relation Age of Onset  . Hypertension Mother   . Diabetes Father     Social History Social History   Tobacco Use  . Smoking status: Never Smoker  . Smokeless tobacco: Never Used  Substance Use Topics  . Alcohol use: No  . Drug use: No     Allergies   Latex and Shellfish allergy   Review of Systems Review of Systems   Physical Exam Triage Vital Signs ED Triage Vitals  Enc Vitals Group     BP 04/20/18 1643 107/75     Pulse Rate 04/20/18 1643 (!) 101     Resp 04/20/18 1643 16     Temp 04/20/18 1643 97.7 F (36.5 C)     Temp Source 04/20/18 1643 Oral     SpO2 04/20/18 1643 96 %     Weight --  Height --      Head Circumference --      Peak Flow --      Pain Score 04/20/18 1644 7     Pain Loc --      Pain Edu? --      Excl. in [place]? --    No data found.  Updated Vital Signs BP 107/75 (BP Location: Left Arm)   Pulse (!) 101   Temp 97.7 F (36.5 C) (Oral)   Resp 16   LMP 04/19/2018   SpO2 96%    Physical Exam Constitutional:      General: She is not in acute distress.    Appearance: She is well-developed.  HENT:     Head: Normocephalic and atraumatic.     Right Ear: Tympanic membrane, ear canal and external ear normal.     Left Ear: Tympanic membrane, ear canal and external ear normal.     Nose: Rhinorrhea present.     Mouth/Throat:     Pharynx: Uvula midline.     Tonsils: No tonsillar exudate.  Eyes:     Conjunctiva/sclera: Conjunctivae normal.     Pupils: Pupils are equal, round, and reactive to light.  Cardiovascular:      Rate and Rhythm: Normal rate and regular rhythm.     Heart sounds: Normal heart sounds.  Pulmonary:     Effort: Pulmonary effort is normal.     Breath sounds: Normal breath sounds.  Skin:    General: Skin is warm and dry.  Neurological:     Mental Status: She is alert and oriented to person, place, and time.      UC Treatments / Results  Labs (all labs ordered are listed, but only abnormal results are displayed) Labs Reviewed - No data to display  EKG None  Radiology No results found.  Procedures Procedures (including critical care time)  Medications Ordered in UC Medications - No data to display  Initial Impression / Assessment and Plan / UC Course  I have reviewed the triage vital signs and the nursing notes.  Pertinent labs & imaging results that were available during my care of the patient were reviewed by me and considered in my medical decision making (see chart for details).     Non toxic, afebrile. Benign physical exam. History and physical consistent with viral illness.  Supportive cares recommended. If symptoms worsen or do not improve in the next week to return to be seen or to follow up with PCP.  Patient verbalized understanding and agreeable to plan.    Final Clinical Impressions(s) / UC Diagnoses   Final diagnoses:  Acute upper respiratory infection     Discharge Instructions     Push fluids to ensure adequate hydration and keep secretions thin.  Tylenol and/or ibuprofen as needed for pain or fevers.  Tessalon as needed for cough.  Mucinex to loosen secretions.  Saline nasal spray to help prevent nose bleeds.  If symptoms worsen or do not improve in the next week to return to be seen or to follow up with your PCP.     ED Prescriptions    Medication Sig Dispense Auth. Provider   benzonatate (TESSALON) 100 MG capsule Take 1 capsule (100 mg total) by mouth 3 (three) times daily as needed for cough. 20 capsule Augusto Gamble B, NP   guaiFENesin  (MUCINEX) 600 MG 12 hr tablet Take 2 tablets (1,200 mg total) by mouth 2 (two) times daily as needed for up to 5 days.  20 tablet Augusto Gamble B, NP   sodium chloride (OCEAN) 0.65 % SOLN nasal spray Place 1 spray into both nostrils as needed for congestion. 60 mL Augusto Gamble B, NP     Controlled Substance Prescriptions [place] Controlled Substance Registry consulted? Not Applicable   Zigmund Gottron, NP 04/20/18 [phone]

## 2019-11-01 DIAGNOSIS — Z3400 Encounter for supervision of normal first pregnancy, unspecified trimester: Secondary | ICD-10-CM | POA: Insufficient documentation

## 2019-11-21 DIAGNOSIS — Z3A21 21 weeks gestation of pregnancy: Secondary | ICD-10-CM | POA: Insufficient documentation

## 2019-11-29 DIAGNOSIS — Z91013 Allergy to seafood: Secondary | ICD-10-CM | POA: Insufficient documentation

## 2019-11-29 DIAGNOSIS — L304 Erythema intertrigo: Secondary | ICD-10-CM | POA: Insufficient documentation

## 2019-11-29 DIAGNOSIS — L2082 Flexural eczema: Secondary | ICD-10-CM | POA: Insufficient documentation

## 2020-03-31 DIAGNOSIS — F410 Panic disorder [episodic paroxysmal anxiety] without agoraphobia: Secondary | ICD-10-CM | POA: Insufficient documentation

## 2020-03-31 DIAGNOSIS — Z98891 History of uterine scar from previous surgery: Secondary | ICD-10-CM | POA: Insufficient documentation

## 2021-06-02 ENCOUNTER — Ambulatory Visit
Admission: EM | Admit: 2021-06-02 | Discharge: 2021-06-02 | Disposition: A | Discharge status: Home / Self Care | Payer: Managed Care, Other (non HMO)

## 2021-06-02 DIAGNOSIS — H6123 Impacted cerumen, bilateral: Secondary | ICD-10-CM

## 2021-06-02 DIAGNOSIS — J069 Acute upper respiratory infection, unspecified: Secondary | ICD-10-CM

## 2021-06-02 NOTE — Discharge Instructions (Signed)
?  Try wax softening drops you can get OTC. Follow up with any further concerns.  ?

## 2021-06-02 NOTE — ED Provider Notes (Signed)
?[place] ? ? ? ?CSN: [id number] ?Arrival date & time: 06/02/21  1808 ? ? ?  ? ?History   ?Chief Complaint ?Chief Complaint  ?Patient presents with  ? Otalgia  ? ? ?HPI ?Ann Avery is a 26 y.o. female.  ? ?Patient here today for evaluation of bilateral ear pain that started yesterday. She has had some mild congestion but no other symptoms. She does report some muffled hearing. She has not had fever. She has been using flonase.  ? ?The history is provided by the patient.  ? ?Past Medical History:  ?Diagnosis Date  ? Crohn disease ([place])   ? Ulcerative colitis   ? ? ?Patient Active Problem List  ? Diagnosis Date Noted  ? Screening-pulmonary TB 04/21/2016  ? ? ?Past Surgical History:  ?Procedure Laterality Date  ? COLONOSCOPY    ? ? ?OB History   ?No obstetric history on file. ?  ? ? ? ?Home Medications   ? ?Prior to Admission medications   ?Medication Sig Start Date End Date Taking? Authorizing Provider  ?benzonatate (TESSALON) 100 MG capsule Take 1 capsule (100 mg total) by mouth 3 (three) times daily as needed for cough. 04/20/18   Zigmund Gottron, NP  ?guanFACINE (INTUNIV) 4 MG TB24 SR tablet Take 4 mg by mouth daily.    [provider]  ?levonorgestrel-ethinyl estradiol (AVIANE,ALESSE,LESSINA) 0.1-20 MG-MCG tablet Take 1 tablet by mouth daily.    [provider]  ?mesalamine (PENTASA) 500 MG CR capsule Take 500 mg by mouth 2 (two) times daily.     [provider]  ?methylphenidate (CONCERTA) 36 MG CR tablet Take 36 mg by mouth every morning.    [provider]  ?ondansetron (ZOFRAN ODT) 4 MG disintegrating tablet Take 1 tablet (4 mg total) by mouth every 8 (eight) hours as needed for nausea or vomiting. 05/24/17   Charlann Lange, PA-C  ?phenol (CHLORASEPTIC) 1.4 % LIQD Use as directed 1 spray in the mouth or throat as needed for throat irritation / pain. 04/23/17   Tasia Catchings, Amy V, PA-C  ?sodium chloride (OCEAN) 0.65 % SOLN nasal spray Place 1 spray into both nostrils as  needed for congestion. 04/20/18   Zigmund Gottron, NP  ? ? ?Family History ?Family History  ?Problem Relation Age of Onset  ? Hypertension Mother   ? Diabetes Father   ? ? ?Social History ?Social History  ? ?Tobacco Use  ? Smoking status: Never  ? Smokeless tobacco: Never  ?Substance Use Topics  ? Alcohol use: No  ? Drug use: No  ? ? ? ?Allergies   ?Latex and Shellfish allergy ? ? ?Review of Systems ?Review of Systems  ?Constitutional:  Negative for chills and fever.  ?HENT:  Positive for congestion and ear pain.   ?Eyes:  Negative for discharge and redness.  ?Respiratory:  Negative for cough and shortness of breath.   ?Gastrointestinal:  Negative for abdominal pain, nausea and vomiting.  ? ? ?Physical Exam ?Triage Vital Signs ?ED Triage Vitals  ?Enc Vitals Group  ?   BP   ?   Pulse   ?   Resp   ?   Temp   ?   Temp src   ?   SpO2   ?   Weight   ?   Height   ?   Head Circumference   ?   Peak Flow   ?   Pain Score   ?   Pain Loc   ?  Pain Edu?   ?   Excl. in [place]?   ? ?No data found. ? ?Updated Vital Signs ?BP 113/78 (BP Location: Right Arm)   Pulse 84   Temp 98.1 ?F (36.7 ?C) (Oral)   Resp 18   LMP 05/24/2021   SpO2 94%  ?  ? ?Physical Exam ?Vitals and nursing note reviewed.  ?Constitutional:   ?   General: She is not in acute distress. ?   Appearance: Normal appearance. She is not ill-appearing.  ?HENT:  ?   Head: Normocephalic and atraumatic.  ?   Ears:  ?   Comments: Unable to visualize Tms due to cerumen in EACs ?Eyes:  ?   Conjunctiva/sclera: Conjunctivae normal.  ?Cardiovascular:  ?   Rate and Rhythm: Normal rate.  ?Pulmonary:  ?   Effort: Pulmonary effort is normal.  ?Neurological:  ?   Mental Status: She is alert.  ?Psychiatric:     ?   Mood and Affect: Mood normal.     ?   Behavior: Behavior normal.     ?   Thought Content: Thought content normal.  ? ? ? ?UC Treatments / Results  ?Labs ?(all labs ordered are listed, but only abnormal results are displayed) ?Labs Reviewed - No data to  display ? ?EKG ? ? ?Radiology ?No results found. ? ?Procedures ?Procedures (including critical care time) ? ?Medications Ordered in UC ?Medications - No data to display ? ?Initial Impression / Assessment and Plan / UC Course  ?I have reviewed the triage vital signs and the nursing notes. ? ?Pertinent labs & imaging results that were available during my care of the patient were reviewed by me and considered in my medical decision making (see chart for details). ? ?  ?Suspect congestion is due to viral URI vs allergies. Recommended symptomatic treatment for same. Discussed that I could not visualize Tms but that discomfort could be related to cerumen in EACs. Patient declines irrigation in office and prefers to use drops at home. Encouraged follow up if no improvement with at home treatment or sooner with any concerns.  ? ?Final Clinical Impressions(s) / UC Diagnoses  ? ?Final diagnoses:  ?Bilateral impacted cerumen  ?Acute upper respiratory infection  ? ? ? ?Discharge Instructions   ? ?  ? ?Try wax softening drops you can get OTC. Follow up with any further concerns.  ? ? ? ? ?ED Prescriptions   ?None ?  ? ?PDMP not reviewed this encounter. ?  ?Francene Finders, PA-C ?06/03/21 [phone] ? ?

## 2021-06-02 NOTE — ED Triage Notes (Signed)
Patient presents to Urgent Care with complaints of otalgia since yesterday. Patient reports taking otc pain med drops for ears and muffled hearing.   ?

## 2021-06-03 ENCOUNTER — Encounter: Payer: Self-pay | Admitting: Physician Assistant

## 2021-06-16 ENCOUNTER — Emergency Department (HOSPITAL_COMMUNITY): Payer: Commercial Managed Care - HMO

## 2021-06-16 ENCOUNTER — Emergency Department (HOSPITAL_COMMUNITY)
Admission: EM | Admit: 2021-06-16 | Discharge: 2021-06-16 | Disposition: A | Discharge status: Home / Self Care | Payer: Commercial Managed Care - HMO | Attending: Emergency Medicine | Admitting: Emergency Medicine

## 2021-06-16 ENCOUNTER — Ambulatory Visit (HOSPITAL_COMMUNITY)
Admission: EM | Admit: 2021-06-16 | Discharge: 2021-06-16 | Disposition: A | Discharge status: Home / Self Care | Payer: Managed Care, Other (non HMO)

## 2021-06-16 ENCOUNTER — Encounter (HOSPITAL_COMMUNITY): Payer: Self-pay | Admitting: Emergency Medicine

## 2021-06-16 ENCOUNTER — Other Ambulatory Visit: Payer: Self-pay

## 2021-06-16 DIAGNOSIS — R202 Paresthesia of skin: Secondary | ICD-10-CM | POA: Diagnosis not present

## 2021-06-16 DIAGNOSIS — Z91040 Latex allergy status: Secondary | ICD-10-CM | POA: Insufficient documentation

## 2021-06-16 DIAGNOSIS — M436 Torticollis: Secondary | ICD-10-CM | POA: Diagnosis not present

## 2021-06-16 DIAGNOSIS — R200 Anesthesia of skin: Secondary | ICD-10-CM | POA: Diagnosis present

## 2021-06-16 LAB — COMPREHENSIVE METABOLIC PANEL
ALT: 11 U/L (ref 0–44)
AST: 18 U/L (ref 15–41)
Albumin: 3.6 g/dL (ref 3.5–5.0)
Alkaline Phosphatase: 62 U/L (ref 38–126)
Anion gap: 6 (ref 5–15)
BUN: 7 mg/dL (ref 6–20)
CO2: 23 mmol/L (ref 22–32)
Calcium: 9.2 mg/dL (ref 8.9–10.3)
Chloride: 107 mmol/L (ref 98–111)
Creatinine, Ser: 0.72 mg/dL (ref 0.44–1.00)
GFR, Estimated: >60 mL/min (ref >60)
Glucose, Bld: 97 mg/dL (ref 70–99)
Potassium: 4.1 mmol/L (ref 3.5–5.1)
Sodium: 136 mmol/L (ref 135–145)
Total Bilirubin: 0.4 mg/dL (ref 0.3–1.2)
Total Protein: 7.6 g/dL (ref 6.5–8.1)

## 2021-06-16 LAB — CBC WITH DIFFERENTIAL/PLATELET
Abs Immature Granulocytes: 0.02 10*3/uL (ref 0.00–0.07)
Basophils Absolute: 0.1 10*3/uL (ref 0.0–0.1)
Basophils Relative: 1 %
Eosinophils Absolute: 0.3 10*3/uL (ref 0.0–0.5)
Eosinophils Relative: 5 %
HCT: 35.3 % — ABNORMAL LOW (ref 36.0–46.0)
Hemoglobin: 11.2 g/dL — ABNORMAL LOW (ref 12.0–15.0)
Immature Granulocytes: 0 %
Lymphocytes Relative: 39 %
Lymphs Abs: 2.8 10*3/uL (ref 0.7–4.0)
MCH: 27.3 pg (ref 26.0–34.0)
MCHC: 31.7 g/dL (ref 30.0–36.0)
MCV: 85.9 fL (ref 80.0–100.0)
Monocytes Absolute: 0.7 10*3/uL (ref 0.1–1.0)
Monocytes Relative: 9 %
Neutro Abs: 3.2 10*3/uL (ref 1.7–7.7)
Neutrophils Relative %: 46 %
Platelets: 397 10*3/uL (ref 150–400)
RBC: 4.11 MIL/uL (ref 3.87–5.11)
RDW: 14.1 % (ref 11.5–15.5)
WBC: 7.1 10*3/uL (ref 4.0–10.5)
nRBC: 0 % (ref 0.0–0.2)

## 2021-06-16 LAB — I-STAT BETA HCG BLOOD, ED (MC, WL, AP ONLY): I-stat hCG, quantitative: <5 m[IU]/mL (ref <5)

## 2021-06-16 MED ORDER — LORAZEPAM 2 MG/ML IJ SOLN: 1.0000 mg | Freq: Once | INTRAMUSCULAR | Status: DC | PRN

## 2021-06-16 MED ORDER — LORAZEPAM 2 MG/ML IJ SOLN
1.0000 mg | Freq: Once | INTRAMUSCULAR | Status: AC
  Administered 2021-06-16: 1 mg via INTRAVENOUS
  Filled 2021-06-16: qty 1

## 2021-06-16 MED ORDER — CYCLOBENZAPRINE HCL 10 MG PO TABS: 10.0000 mg | ORAL_TABLET | Freq: Two times a day (BID) | ORAL | 0 refills | Status: DC | PRN

## 2021-06-16 MED ORDER — KETOROLAC TROMETHAMINE 15 MG/ML IJ SOLN
15.0000 mg | Freq: Once | INTRAMUSCULAR | Status: AC
Start: 2021-06-16 — End: 2021-06-16
  Administered 2021-06-16: 15 mg via INTRAVENOUS
  Filled 2021-06-16: qty 1

## 2021-06-16 MED ORDER — IBUPROFEN 600 MG PO TABS: 600.0000 mg | ORAL_TABLET | Freq: Three times a day (TID) | ORAL | 0 refills | Status: DC | PRN

## 2021-06-16 NOTE — Discharge Instructions (Addendum)
Return for any problem.  ?

## 2021-06-16 NOTE — ED Notes (Signed)
Legal Guardian/Grandmother Ann Avery [phone] wants an update asap and has her insurance info if needed  ?

## 2021-06-16 NOTE — ED Provider Notes (Signed)
?[facility] ?Provider Note ? ? ?CSN: [id number] ?Arrival date & time: 06/16/21  1157 ? ?  ? ?History ? ?Chief Complaint  ?Patient presents with  ? Numbness  ?  Right arm, right leg  ? ? ?Ann Avery is a 26 y.o. female. ? ?26 year old female with prior medical history as detailed below presents for evaluation.  Patient reports that she went to bed last night around 11 PM without incident.  This morning she woke up around 6 AM and significant pain to the right neck.  Patient reports that the pain to the right neck and radiates to her entire right body into her right arm and her right leg.  She reports that she cannot "feel anything" in her right side. ? ?She denies head injury or fall.  She denies alcohol or drug use. ? ?She is able to ambulate.  Patient was seen by urgent care and sent to the ED for evaluation. ? ?The history is provided by the patient and medical records.  ?Illness ?Location:  Right-sided neck pain with right-sided " numbness of the body" ?Severity:  Mild ?Onset quality:  Sudden ?Duration:  10 hours ?Timing:  Constant ?Progression:  Unchanged ?Chronicity:  New ? ?  ? ?Home Medications ?Prior to Admission medications   ?Medication Sig Start Date End Date Taking? Authorizing Provider  ?benzonatate (TESSALON) 100 MG capsule Take 1 capsule (100 mg total) by mouth 3 (three) times daily as needed for cough. 04/20/18   Zigmund Gottron, NP  ?guanFACINE (INTUNIV) 4 MG TB24 SR tablet Take 4 mg by mouth daily.    [provider]  ?levonorgestrel-ethinyl estradiol (AVIANE,ALESSE,LESSINA) 0.1-20 MG-MCG tablet Take 1 tablet by mouth daily.    [provider]  ?mesalamine (PENTASA) 500 MG CR capsule Take 500 mg by mouth 2 (two) times daily.     [provider]  ?methylphenidate (CONCERTA) 36 MG CR tablet Take 36 mg by mouth every morning.    [provider]  ?ondansetron (ZOFRAN ODT) 4 MG disintegrating tablet Take 1 tablet (4 mg total)  by mouth every 8 (eight) hours as needed for nausea or vomiting. 05/24/17   Charlann Lange, PA-C  ?phenol (CHLORASEPTIC) 1.4 % LIQD Use as directed 1 spray in the mouth or throat as needed for throat irritation / pain. 04/23/17   Tasia Catchings, Amy V, PA-C  ?sodium chloride (OCEAN) 0.65 % SOLN nasal spray Place 1 spray into both nostrils as needed for congestion. 04/20/18   Zigmund Gottron, NP  ?   ? ?Allergies    ?Latex and Shellfish allergy   ? ?Review of Systems   ?Review of Systems  ?All other systems reviewed and are negative. ? ?Physical Exam ?Updated Vital Signs ?BP 102/67 (BP Location: Left Arm)   Pulse 97   Temp 98.7 ?F (37.1 ?C) (Oral)   Resp 16   LMP 05/24/2021   SpO2 100%  ?Physical Exam ?Vitals and nursing note reviewed.  ?Constitutional:   ?   General: She is not in acute distress. ?   Appearance: Normal appearance. She is well-developed.  ?HENT:  ?   Head: Normocephalic and atraumatic.  ?Eyes:  ?   Conjunctiva/sclera: Conjunctivae normal.  ?   Pupils: Pupils are equal, round, and reactive to light.  ?Cardiovascular:  ?   Rate and Rhythm: Normal rate and regular rhythm.  ?   Heart sounds: Normal heart sounds.  ?Pulmonary:  ?   Effort: Pulmonary effort is normal. No respiratory distress.  ?  Breath sounds: Normal breath sounds.  ?Abdominal:  ?   General: There is no distension.  ?   Palpations: Abdomen is soft.  ?   Tenderness: There is no abdominal tenderness.  ?Musculoskeletal:     ?   General: No deformity. Normal range of motion.  ?   Cervical back: Normal range of motion and neck supple.  ?Skin: ?   General: Skin is warm and dry.  ?Neurological:  ?   General: No focal deficit present.  ?   Mental Status: She is alert and oriented to person, place, and time.  ?   Comments: Alert and oriented x4 ? ?No facial ? ?Normal speech ? ?Patient is endorsing significant pain to the right lateral neck.  She complains of feeling numbness to the right arm and right leg.  With attempted movement of her right arm she says  she has significant pain. ? ?Shortly after exam, patient was seen in the hallway using her right hand to text on her phone without apparent difficulty.  ? ? ?ED Results / Procedures / Treatments   ?Labs ?(all labs ordered are listed, but only abnormal results are displayed) ?Labs Reviewed  ?CBC WITH DIFFERENTIAL/PLATELET - Abnormal; Notable for the following components:  ?    Result Value  ? Hemoglobin 11.2 (*)   ? HCT 35.3 (*)   ? All other components within normal limits  ?COMPREHENSIVE METABOLIC PANEL  ?I-STAT BETA HCG BLOOD, ED ([facility], [facility], [facility] ONLY)  ? ? ?EKG ?None ? ?Radiology ?CT HEAD WO CONTRAST (5MM) ? ?Result Date: 06/16/2021 ?CLINICAL DATA:  Neuro deficit, acute, stroke suspected. Right-sided head pain extending in the right side of body. Numbness throughout the right side of the body. EXAM: CT HEAD WITHOUT CONTRAST TECHNIQUE: Contiguous axial images were obtained from the base of the skull through the vertex without intravenous contrast. RADIATION DOSE REDUCTION: This exam was performed according to the departmental dose-optimization program which includes automated exposure control, adjustment of the mA and/or kV according to patient size and/or use of iterative reconstruction technique. COMPARISON:  CT head without contrast 06/10/2011. FINDINGS: Brain: No acute infarct, hemorrhage, or mass lesion is present. No significant white matter lesions are present. The ventricles are of normal size. No significant extraaxial fluid collection is present. The basal ganglia are within normal limits. The brainstem and cerebellum are within normal limits. 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 Vascular: No hyperdense vessel or unexpected calcification. Skull: Calvarium is intact. No focal lytic or blastic lesions are present. No significant extracranial soft tissue lesion is present. Sinuses/Orbits: Fluid levels are present the maxillary sinuses bilaterally. Chronic wall thickening is present in the posterior  maxillary sinuses. Right anterior ethmoid and frontal sinus opacification also noted. The paranasal sinuses and mastoid air cells are otherwise clear. The globes and orbits are within normal limits. IMPRESSION: 1. Normal CT appearance of the brain. 2. 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 3. Right anterior ethmoid and frontal sinus disease. Electronically Signed   By: San Morelle M.D.   On: 06/16/2021 13:38   ? ?Procedures ?Procedures  ? ? ?Medications Ordered in ED ?Medications  ?LORazepam (ATIVAN) injection 1 mg (1 mg Intravenous Given 06/16/21 1726)  ? ? ?ED Course/ Medical Decision Making/ A&P ?  ?                        ?Medical Decision Making ?Amount and/or Complexity of Data Reviewed ?Radiology: ordered. ? ?Risk ?Prescription  drug management. ? ? ? ?Medical Screen Complete ? ?This patient presented to the ED with complaint of right-sided neck and body pain. ? ?This complaint involves an extensive number of treatment options. The initial differential diagnosis includes, but is not limited to, cervical neck pathology, intracranial pathology, anxiety state, etc. ? ?This presentation is: Acute, Self-Limited, Previously Undiagnosed, Uncertain Prognosis, Complicated, Systemic Symptoms, and Threat to Life/Bodily Function ? ?Patient presents with complaint of right-sided neck pain.  Apparently this pain started this a.m. around 6 AM. ? ?She complains of concurrent numbness to the right arm and right leg. ? ?Patient's exam is not consistent.  Patient intermittently seems to be unable or unwilling to move her right arm and right leg. ? ?CT imaging obtained thus far is without significant abnormality. ? ?Out of abundance of caution, patient will obtain MRI brain and C-spine to rule out other significant pathology. ? ?Attempt made at MRI imaging.  Patient did not tolerate MRI. ? ?Upon return to the ED after attempted MRI the patient now feels improved. ? ?She is able to ambulate without difficulty.   She reports that her pain in the neck is significantly improved after administration of Toradol.  She and her family under standing for close outpatient follow-up.  She and her family declined repeat atte

## 2021-06-16 NOTE — ED Notes (Signed)
Pt at MRI since this shift began  ?

## 2021-06-16 NOTE — ED Triage Notes (Signed)
Patient coming from [facility], states woke up this morning at 6 with right neck pain, now having right arm and leg pain and numbness. Pt very anxious @ triage. ?

## 2021-06-16 NOTE — ED Triage Notes (Signed)
Pt reports that when went to bed last night at 930p. When woke up today at 6am having pain on right side of head that radiates all way down right side of body. Reports has numbness on right side of body.  ?

## 2021-06-16 NOTE — ED Provider Triage Note (Signed)
Emergency Medicine Provider Triage Evaluation Note ? ?Ann Avery , a 26 y.o. female  was evaluated in triage.  Pt complains of numbness and pain to the entire right side of her body.  Patient reports that she woke this morning at 6:00 and had pain that started in her neck and then began radiating through the entire right side of her body.  Patient reports that the entire right side of her body has been numb since then.  Patient went to bed last night at 2300 and did not have any symptoms prior to waking this morning.  Patient denies any recent falls or head injuries.  Denies any illicit drug or alcohol use. ? ?Patient denies any facial asymmetry, dysarthria, visual disturbance. ? ?Review of Systems  ?Positive: Numbness ?Negative: See above ? ?Physical Exam  ?BP 112/71 (BP Location: Left Arm)   Pulse (!) 102   Temp 98.6 ?F (37 ?C) (Oral)   Resp 17   LMP 05/24/2021   SpO2 100%  ?Gen:   Awake, no distress   ?Resp:  Normal effort  ?MSK:   Moves extremities without difficulty  ?Other:  EOM intact bilaterally.  Pupils PERRL.  No facial asymmetry or dysarthria.  Patient reports no sensation to light touch to the entire right side of her face, right arm, and right lower extremity.  Pronator drift negative.  Patient able to stand and ambulate without difficulty. ? ?Medical Decision Making  ?Medically screening exam initiated at 12:18 PM.  Appropriate orders placed.  Ann Avery was informed that the remainder of the evaluation will be completed by another provider, this initial triage assessment does not replace that evaluation, and the importance of remaining in the ED until their evaluation is complete. ? ? ?  ?Loni Beckwith, PA-C ?06/16/21 1220 ? ?

## 2021-06-16 NOTE — ED Notes (Signed)
Patient is being discharged from the Urgent Care and sent to the Emergency Department via POV with boyfriend . Per Dr. Lanny Cramp, patient is in need of higher level of care due to numbness on right side of body. Patient is aware and verbalizes understanding of plan of care.  ?Vitals:  ? 06/16/21 1150  ?BP: 120/84  ?Pulse: (!) 104  ?Resp: 19  ?SpO2: 100%  ?  ?

## 2021-11-25 ENCOUNTER — Other Ambulatory Visit: Payer: Self-pay

## 2021-11-25 ENCOUNTER — Emergency Department (HOSPITAL_COMMUNITY)
Admission: EM | Admit: 2021-11-25 | Discharge: 2021-11-25 | Disposition: A | Discharge status: Home / Self Care | Payer: Managed Care, Other (non HMO) | Attending: Emergency Medicine | Admitting: Emergency Medicine

## 2021-11-25 ENCOUNTER — Emergency Department (HOSPITAL_COMMUNITY): Payer: Managed Care, Other (non HMO)

## 2021-11-25 ENCOUNTER — Encounter (HOSPITAL_COMMUNITY): Payer: Self-pay

## 2021-11-25 DIAGNOSIS — R1032 Left lower quadrant pain: Secondary | ICD-10-CM | POA: Diagnosis present

## 2021-11-25 DIAGNOSIS — Z91040 Latex allergy status: Secondary | ICD-10-CM | POA: Insufficient documentation

## 2021-11-25 DIAGNOSIS — R109 Unspecified abdominal pain: Secondary | ICD-10-CM

## 2021-11-25 DIAGNOSIS — K5090 Crohn's disease, unspecified, without complications: Secondary | ICD-10-CM | POA: Insufficient documentation

## 2021-11-25 DIAGNOSIS — Z20822 Contact with and (suspected) exposure to covid-19: Secondary | ICD-10-CM | POA: Diagnosis not present

## 2021-11-25 LAB — CBC
HCT: 32.5 % — ABNORMAL LOW (ref 36.0–46.0)
Hemoglobin: 10.2 g/dL — ABNORMAL LOW (ref 12.0–15.0)
MCH: 26.7 pg (ref 26.0–34.0)
MCHC: 31.4 g/dL (ref 30.0–36.0)
MCV: 85.1 fL (ref 80.0–100.0)
Platelets: 383 10*3/uL (ref 150–400)
RBC: 3.82 MIL/uL — ABNORMAL LOW (ref 3.87–5.11)
RDW: 15.5 % (ref 11.5–15.5)
WBC: 8 10*3/uL (ref 4.0–10.5)
nRBC: 0 % (ref 0.0–0.2)

## 2021-11-25 LAB — COMPREHENSIVE METABOLIC PANEL
ALT: 13 U/L (ref 0–44)
AST: 19 U/L (ref 15–41)
Albumin: 3.4 g/dL — ABNORMAL LOW (ref 3.5–5.0)
Alkaline Phosphatase: 62 U/L (ref 38–126)
Anion gap: 5 (ref 5–15)
BUN: 10 mg/dL (ref 6–20)
CO2: 22 mmol/L (ref 22–32)
Calcium: 8.6 mg/dL — ABNORMAL LOW (ref 8.9–10.3)
Chloride: 109 mmol/L (ref 98–111)
Creatinine, Ser: 0.76 mg/dL (ref 0.44–1.00)
GFR, Estimated: >60 mL/min (ref >60)
Glucose, Bld: 105 mg/dL — ABNORMAL HIGH (ref 70–99)
Potassium: 3.7 mmol/L (ref 3.5–5.1)
Sodium: 136 mmol/L (ref 135–145)
Total Bilirubin: 0.2 mg/dL — ABNORMAL LOW (ref 0.3–1.2)
Total Protein: 7.4 g/dL (ref 6.5–8.1)

## 2021-11-25 LAB — RESP PANEL BY RT-PCR (FLU A&B, COVID) ARPGX2
Influenza A by PCR: NEGATIVE
Influenza B by PCR: NEGATIVE
SARS Coronavirus 2 by RT PCR: NEGATIVE

## 2021-11-25 LAB — RAPID URINE DRUG SCREEN, HOSP PERFORMED
Amphetamines: NOT DETECTED
Barbiturates: NOT DETECTED
Benzodiazepines: NOT DETECTED
Cocaine: NOT DETECTED
Opiates: NOT DETECTED
Tetrahydrocannabinol: NOT DETECTED

## 2021-11-25 LAB — SEDIMENTATION RATE: Sed Rate: 31 mm/hr — ABNORMAL HIGH (ref 0–22)

## 2021-11-25 LAB — URINALYSIS, ROUTINE W REFLEX MICROSCOPIC
Bilirubin Urine: NEGATIVE
Glucose, UA: NEGATIVE mg/dL
Hgb urine dipstick: NEGATIVE
Ketones, ur: NEGATIVE mg/dL
Leukocytes,Ua: NEGATIVE
Nitrite: NEGATIVE
Protein, ur: NEGATIVE mg/dL
Specific Gravity, Urine: 1.015 (ref 1.005–1.030)
pH: 5 (ref 5.0–8.0)

## 2021-11-25 LAB — SALICYLATE LEVEL: Salicylate Lvl: <7 mg/dL — ABNORMAL LOW (ref 7.0–30.0)

## 2021-11-25 LAB — HCG, QUANTITATIVE, PREGNANCY: hCG, Beta Chain, Quant, S: <1 m[IU]/mL (ref <5)

## 2021-11-25 LAB — ACETAMINOPHEN LEVEL: Acetaminophen (Tylenol), Serum: <10 ug/mL — ABNORMAL LOW (ref 10–30)

## 2021-11-25 LAB — LIPASE, BLOOD: Lipase: 50 U/L (ref 11–51)

## 2021-11-25 LAB — ETHANOL: Alcohol, Ethyl (B): <10 mg/dL (ref <10)

## 2021-11-25 LAB — C-REACTIVE PROTEIN: CRP: 1.4 mg/dL — ABNORMAL HIGH (ref <1.0)

## 2021-11-25 MED ORDER — ACETAMINOPHEN 325 MG PO TABS: 650.0000 mg | ORAL_TABLET | Freq: Four times a day (QID) | ORAL | 0 refills | Status: DC | PRN

## 2021-11-25 MED ORDER — KETOROLAC TROMETHAMINE 15 MG/ML IJ SOLN
15.0000 mg | Freq: Once | INTRAMUSCULAR | Status: AC
  Administered 2021-11-25: 15 mg via INTRAVENOUS
  Filled 2021-11-25: qty 1

## 2021-11-25 MED ORDER — SODIUM CHLORIDE 0.9 % IV BOLUS
1000.0000 mL | Freq: Once | INTRAVENOUS | Status: AC
  Administered 2021-11-25: 1000 mL via INTRAVENOUS

## 2021-11-25 MED ORDER — ONDANSETRON HCL 4 MG PO TABS: 4.0000 mg | ORAL_TABLET | ORAL | 0 refills | Status: DC | PRN

## 2021-11-25 MED ORDER — IOHEXOL 300 MG/ML  SOLN
100.0000 mL | Freq: Once | INTRAMUSCULAR | Status: AC | PRN
  Administered 2021-11-25: 100 mL via INTRAVENOUS

## 2021-11-25 MED ORDER — SODIUM CHLORIDE (PF) 0.9 % IJ SOLN
INTRAMUSCULAR | Status: AC
  Filled 2021-11-25: qty 50

## 2021-11-25 MED ORDER — PREDNISONE 10 MG (21) PO TBPK: ORAL_TABLET | Freq: Every day | ORAL | 0 refills | Status: DC

## 2021-11-25 NOTE — ED Notes (Signed)
Patient reports told registration that she is suicidal

## 2021-11-25 NOTE — ED Notes (Signed)
EDP states pt will not be a psych hold/need psych treatment and will be d/c following UA result. All belongings returned to pt.

## 2021-11-25 NOTE — ED Notes (Signed)
Patient is sitting in hallway in recliner.

## 2021-11-25 NOTE — ED Triage Notes (Addendum)
Patient reports abdominal pain in the LLQ, with nausea, vomiting, and diarrhea. Pain is 10/10. Hx of Chron's Disease. 120/80, 102HR.

## 2021-11-25 NOTE — ED Notes (Signed)
Pt informed need for urine sample, will ask pt following bolus completion.

## 2021-11-25 NOTE — ED Notes (Signed)
PT belongings place at triage nurse desk

## 2021-11-25 NOTE — ED Notes (Signed)
Patient stated she started having suicidal thoughts a few minutes ago. Patient states she has not plan but has had these thoughts in the past. States 2-3 years ago she had suicidal thoughts. Patient is now being dressed out into burgundy scrubs. Unable to give a urine sample at this time.

## 2021-11-25 NOTE — ED Notes (Signed)
Pt advises being unable to void for urine sample at this time.

## 2021-11-25 NOTE — ED Provider Notes (Signed)
[facility]-EMERGENCY DEPT Provider Note   CSN: [id number] Arrival date & time: 11/25/21  0220     History  Chief Complaint  Patient presents with   Abdominal Pain    Ann Avery is a 26 y.o. female.  Patient as above with significant medical history as below, including Crohn's disease, follows with Dr. Magod who presents to the ED with complaint of left lower quad abdominal pain.  Symptoms onset approximately 2 to 3 hours prior to arrival.  Left lower quadrant, sharp stabbing pain.  1x diarrhea .  No BRBPR or melena.  1 episode of emesis, nausea since resolved.  No change urination.  No fevers or chills.  No chest pain or dyspnea.  No pain elsewhere in her abdomen besides left lower quadrant.  Feels similar to prior episodes of Crohn's disease flare.  She has not seen her GI doctor in over 1 year.  She is compliant with her home medications.  Recent travel or sick contacts.  No recent dietary changes.  She is on mesalamine.     Past Medical History:  Diagnosis Date   Crohn disease (HCC)    Ulcerative colitis     Past Surgical History:  Procedure Laterality Date   COLONOSCOPY       The history is provided by the patient. No language interpreter was used.  Abdominal Pain Associated symptoms: diarrhea, nausea and vomiting   Associated symptoms: no chest pain, no cough, no dysuria, no fever and no shortness of breath        Home Medications Prior to Admission medications   Medication Sig Start Date End Date Taking? Authorizing Provider  acetaminophen (TYLENOL) 325 MG tablet Take 2 tablets (650 mg total) by mouth every 6 (six) hours as needed. 11/25/21  Yes ,  A, DO  ondansetron (ZOFRAN) 4 MG tablet Take 1 tablet (4 mg total) by mouth every 4 (four) hours as needed for nausea or vomiting. 11/25/21  Yes ,  A, DO  predniSONE (STERAPRED UNI-PAK 21 TAB) 10 MG (21) TBPK tablet Take by mouth daily. Take 6 tabs by mouth daily  for 2 days, then  5 tabs for 2 days, then 4 tabs for 2 days, then 3 tabs for 2 days, 2 tabs for 2 days, then 1 tab by mouth daily for 2 days 11/25/21  Yes ,  A, DO  benzonatate (TESSALON) 100 MG capsule Take 1 capsule (100 mg total) by mouth 3 (three) times daily as needed for cough. 04/20/18   Burky, Natalie B, NP  cyclobenzaprine (FLEXERIL) 10 MG tablet Take 1 tablet (10 mg total) by mouth 2 (two) times daily as needed for muscle spasms. 06/16/21   Messick, Peter C, MD  guanFACINE (INTUNIV) 4 MG TB24 SR tablet Take 4 mg by mouth daily.    [provider]  ibuprofen (ADVIL) 600 MG tablet Take 1 tablet (600 mg total) by mouth every 8 (eight) hours as needed. 06/16/21   Messick, Peter C, MD  levonorgestrel-ethinyl estradiol (AVIANE,ALESSE,LESSINA) 0.1-20 MG-MCG tablet Take 1 tablet by mouth daily.    [provider]  mesalamine (PENTASA) 500 MG CR capsule Take 500 mg by mouth 2 (two) times daily.     [provider]  methylphenidate (CONCERTA) 36 MG CR tablet Take 36 mg by mouth every morning.    [provider]  phenol (CHLORASEPTIC) 1.4 % LIQD Use as directed 1 spray in the mouth or throat as needed for throat irritation / pain. 04/23/17     Tasia Catchings, Amy V, PA-C  sodium chloride (OCEAN) 0.65 % SOLN nasal spray Place 1 spray into both nostrils as needed for congestion. 04/20/18   Zigmund Gottron, NP      Allergies    Latex and Shellfish allergy    Review of Systems   Review of Systems  Constitutional:  Negative for activity change and fever.  HENT:  Negative for facial swelling and trouble swallowing.   Eyes:  Negative for discharge and redness.  Respiratory:  Negative for cough and shortness of breath.   Cardiovascular:  Negative for chest pain and palpitations.  Gastrointestinal:  Positive for abdominal pain, diarrhea, nausea and vomiting.  Genitourinary:  Negative for dysuria and flank pain.  Musculoskeletal:  Negative for back pain and gait problem.  Skin:  Negative for  pallor and rash.  Neurological:  Negative for syncope and headaches.    Physical Exam Updated Vital Signs BP 117/74   Pulse 82   Temp 97.9 F (36.6 C) (Oral)   Resp 18   SpO2 100%  Physical Exam Vitals and nursing note reviewed.  Constitutional:      General: She is not in acute distress.    Appearance: Normal appearance. She is well-developed. She is not ill-appearing or toxic-appearing.  HENT:     Head: Normocephalic and atraumatic.     Right Ear: External ear normal.     Left Ear: External ear normal.     Nose: Nose normal.     Mouth/Throat:     Mouth: Mucous membranes are moist.  Eyes:     General: No scleral icterus.       Right eye: No discharge.        Left eye: No discharge.  Cardiovascular:     Rate and Rhythm: Normal rate and regular rhythm.     Pulses: Normal pulses.     Heart sounds: Normal heart sounds.  Pulmonary:     Effort: Pulmonary effort is normal. No respiratory distress.     Breath sounds: Normal breath sounds.  Abdominal:     General: Abdomen is flat.     Palpations: Abdomen is soft.     Tenderness: There is abdominal tenderness in the left lower quadrant. There is no guarding or rebound.     Comments: Not peritoneal  Musculoskeletal:        General: Normal range of motion.     Cervical back: Normal range of motion.     Right lower leg: No edema.     Left lower leg: No edema.  Skin:    General: Skin is warm and dry.     Capillary Refill: Capillary refill takes less than 2 seconds.  Neurological:     Mental Status: She is alert.  Psychiatric:        Attention and Perception: Attention normal.        Mood and Affect: Mood normal.        Speech: Speech normal.        Behavior: Behavior normal. Behavior is cooperative.        Thought Content: Thought content does not include homicidal or suicidal ideation. Thought content does not include homicidal or suicidal plan.     ED Results / Procedures / Treatments   Labs (all labs ordered are  listed, but only abnormal results are displayed) Labs Reviewed  COMPREHENSIVE METABOLIC PANEL - Abnormal; Notable for the following components:      Result Value   Glucose, Bld 105 (*)  Calcium 8.6 (*)    Albumin 3.4 (*)    Total Bilirubin 0.2 (*)    All other components within normal limits  CBC - Abnormal; Notable for the following components:   RBC 3.82 (*)    Hemoglobin 10.2 (*)    HCT 32.5 (*)    All other components within normal limits  URINALYSIS, ROUTINE W REFLEX MICROSCOPIC - Abnormal; Notable for the following components:   Color, Urine STRAW (*)    All other components within normal limits  SALICYLATE LEVEL - Abnormal; Notable for the following components:   Salicylate Lvl <4.7 (*)    All other components within normal limits  ACETAMINOPHEN LEVEL - Abnormal; Notable for the following components:   Acetaminophen (Tylenol), Serum <10 (*)    All other components within normal limits  SEDIMENTATION RATE - Abnormal; Notable for the following components:   Sed Rate 31 (*)    All other components within normal limits  C-REACTIVE PROTEIN - Abnormal; Notable for the following components:   CRP 1.4 (*)    All other components within normal limits  RESP PANEL BY RT-PCR (FLU A&B, COVID) ARPGX2  LIPASE, BLOOD  HCG, QUANTITATIVE, PREGNANCY  ETHANOL  RAPID URINE DRUG SCREEN, HOSP PERFORMED    EKG None  Radiology CT ABDOMEN PELVIS W CONTRAST  Result Date: 11/25/2021 CLINICAL DATA:  26 year old female with history of left lower quadrant abdominal pain. History of Crohn's disease and ulcerative colitis. EXAM: CT ABDOMEN AND PELVIS WITH CONTRAST TECHNIQUE: Multidetector CT imaging of the abdomen and pelvis was performed using the standard protocol following bolus administration of intravenous contrast. RADIATION DOSE REDUCTION: This exam was performed according to the departmental dose-optimization program which includes automated exposure control, adjustment of the mA and/or kV  according to patient size and/or use of iterative reconstruction technique. CONTRAST:  121m OMNIPAQUE IOHEXOL 300 MG/ML  SOLN COMPARISON:  CT the abdomen and pelvis 04/30/2015. FINDINGS: Lower chest: Unremarkable. Hepatobiliary: No suspicious cystic or solid hepatic lesions. No intra or extrahepatic biliary ductal dilatation. Gallbladder is normal in appearance. Pancreas: No pancreatic mass. No pancreatic ductal dilatation. No pancreatic or peripancreatic fluid collections or inflammatory changes. Spleen: Unremarkable. Adrenals/Urinary Tract: Bilateral kidneys and adrenal glands are normal in appearance. No hydroureteronephrosis. Urinary bladder is normal in appearance. Stomach/Bowel: The appearance of the stomach is normal. There is no pathologic dilatation of small bowel or colon. Normal appendix. Distal and terminal ileum are grossly unremarkable in appearance. No definite focal mural thickening or mucosal hyperenhancement is noted associated with the small bowel or colon. Vascular/Lymphatic: No significant atherosclerotic disease, aneurysm or dissection noted in the abdominal or pelvic vasculature. No lymphadenopathy noted in the abdomen or pelvis. Reproductive: Uterus and left ovary are unremarkable in appearance. Small peripherally enhancing lesion in the right adnexa (axial image 68 of series 2) measuring 1.8 cm, most compatible with a degenerating corpus luteum cyst. Other: No significant volume of ascites.  No pneumoperitoneum. Musculoskeletal: There are no aggressive appearing lytic or blastic lesions noted in the visualized portions of the skeleton. IMPRESSION: 1. No acute findings are noted in the abdomen or pelvis to account for the patient's symptoms. Specifically, no definite imaging stigmata to suggest active inflammatory bowel disease. Electronically Signed   By: DVinnie LangtonM.D.   On: 11/25/2021 05:57    Procedures Procedures    Medications Ordered in ED Medications  ketorolac  (TORADOL) 15 MG/ML injection 15 mg (15 mg Intravenous Given 11/25/21 0433)  sodium chloride 0.9 % bolus 1,000 mL (  0 mLs Intravenous Stopped 11/25/21 0632)  sodium chloride (PF) 0.9 % injection (  Given by Other 11/25/21 0615)  iohexol (OMNIPAQUE) 300 MG/ML solution 100 mL (100 mLs Intravenous Contrast Given 11/25/21 0528)    ED Course/ Medical Decision Making/ A&P                           Medical Decision Making Amount and/or Complexity of Data Reviewed Labs: ordered. Radiology: ordered.  Risk OTC drugs. Prescription drug management.   This patient presents to the ED with chief complaint(s) of left lower quad abdominal pain with pertinent past medical history of Crohn's disease which further complicates the presenting complaint. The complaint involves an extensive differential diagnosis and also carries with it a high risk of complications and morbidity.     Differential diagnosis includes but is not exclusive to ectopic pregnancy, ovarian cyst, ovarian torsion, acute appendicitis, urinary tract infection, endometriosis, bowel obstruction, hernia, colitis, renal colic, gastroenteritis, volvulus etc.  . Serious etiologies were considered.   The initial plan is to screening labs ordered in triage, will also collect CT abdomen   Additional history obtained: Additional history obtained from  na Records reviewed  prior ED visits, prior labs and imaging Home medications  Independent labs interpretation:  The following labs were independently interpreted:  CBC with hemoglobin similar to her baseline.  Normocytic.  Today 10.2.  Denies BRBPR or melena. CMP with stable creatinine. ESR is mildly elevated 31.  CRP also elevated 1.4 consistent with Crohn's flare Preg negative UA neg for infection  Independent visualization of imaging: - I independently visualized the following imaging with scope of interpretation limited to determining acute life threatening conditions related to emergency  care: CTAP, which revealed no acute process  Cardiac monitoring was reviewed and interpreted by myself which shows na Pulse oximetry between 98 to 100% on room air.  Treatment and Reassessment: Toradol, IV fluids >>> Symptoms have greatly improved  Consultation: - Consulted or discussed management/test interpretation w/ external professional: na  Consideration for admission or further workup: Admission was considered   Well-appearing 26-year-old female history of Crohn disease to the ED today with left lower quad abdominal pain, nausea, vomiting, concern for Crohn's flare.  Labs reviewed as above.  CT imaging stable.  Symptoms have greatly improved following intervention of Toradol and IV fluids.  Nausea has resolved.  She is tolerant p.o. intake without much difficulty.  No further episodes of diarrhea.  Symptoms are likely secondary to Crohn's flare.  We will start patient on steroids.  Bowel rest/liquid diet encouraged. Have her follow-up with her GI  The patient improved significantly and was discharged in stable condition. Detailed discussions were had with the patient regarding current findings, and need for close f/u with PCP or on call doctor. The patient has been instructed to return immediately if the symptoms worsen in any way for re-evaluation. Patient verbalized understanding and is in agreement with current care plan. All questions answered prior to discharge.    Social Determinants of health: Social History   Tobacco Use   Smoking status: Never   Smokeless tobacco: Never  Substance Use Topics   Alcohol use: No   Drug use: No            Final Clinical Impression(s) / ED Diagnoses Final diagnoses:  Abdominal pain, unspecified abdominal location  Crohn's disease without complication, unspecified gastrointestinal tract location (HCC)    Rx / DC Orders ED Discharge Orders            Ordered    predniSONE (STERAPRED UNI-PAK 21 TAB) 10 MG (21) TBPK tablet  Daily         11/25/21 0647    ondansetron (ZOFRAN) 4 MG tablet  Every 4 hours PRN        11/25/21 0647    acetaminophen (TYLENOL) 325 MG tablet  Every 6 hours PRN        11/25/21 0727              ,  A, DO 11/25/21 0733  

## 2021-11-25 NOTE — Discharge Instructions (Addendum)
It was a pleasure caring for you today in the emergency department.  Please return to the emergency department for any worsening or worrisome symptoms.  Please follow-up with your gastroenterologist

## 2022-01-20 ENCOUNTER — Other Ambulatory Visit: Payer: Self-pay

## 2022-01-20 ENCOUNTER — Emergency Department (HOSPITAL_COMMUNITY)
Admission: EM | Admit: 2022-01-20 | Discharge: 2022-01-20 | Disposition: A | Discharge status: Home / Self Care | Payer: Managed Care, Other (non HMO) | Attending: Medical | Admitting: Medical

## 2022-01-20 DIAGNOSIS — R300 Dysuria: Secondary | ICD-10-CM | POA: Diagnosis present

## 2022-01-20 DIAGNOSIS — R3915 Urgency of urination: Secondary | ICD-10-CM | POA: Insufficient documentation

## 2022-01-20 DIAGNOSIS — Z91040 Latex allergy status: Secondary | ICD-10-CM | POA: Insufficient documentation

## 2022-01-20 DIAGNOSIS — R350 Frequency of micturition: Secondary | ICD-10-CM | POA: Diagnosis not present

## 2022-01-20 LAB — URINALYSIS, ROUTINE W REFLEX MICROSCOPIC
Bilirubin Urine: NEGATIVE
Glucose, UA: NEGATIVE mg/dL
Ketones, ur: NEGATIVE mg/dL
Leukocytes,Ua: NEGATIVE
Nitrite: NEGATIVE
Protein, ur: NEGATIVE mg/dL
Specific Gravity, Urine: 1.02 (ref 1.005–1.030)
pH: 5 (ref 5.0–8.0)

## 2022-01-20 LAB — PREGNANCY, URINE: Preg Test, Ur: NEGATIVE

## 2022-01-20 MED ORDER — SULFAMETHOXAZOLE-TRIMETHOPRIM 800-160 MG PO TABS: 1.0000 | ORAL_TABLET | Freq: Two times a day (BID) | ORAL | 0 refills | Status: AC

## 2022-01-20 MED ORDER — SULFAMETHOXAZOLE-TRIMETHOPRIM 800-160 MG PO TABS
1.0000 | ORAL_TABLET | Freq: Once | ORAL | Status: AC
  Administered 2022-01-20: 1 via ORAL
  Filled 2022-01-20: qty 1

## 2022-01-20 NOTE — ED Triage Notes (Addendum)
Patient coming to ED for painful and burning with urinate.  Reports symptoms started 5 days ago.  Has had itching.

## 2022-01-20 NOTE — Discharge Instructions (Signed)
You were seen in the ER today for your discomfort with urination.  While your urine test was reassuring, your symptoms are concerning enough for urinary tract infection, the decision was made to proceed with antibiotics for at this time.  Will start antibiotics which will be twice per day for the next 5 days.  Please take it as prescribed the entire course and follow-up with your primary care doctor for recheck of your urine in 1 week.  Return to the ER with any new severe symptoms.

## 2022-01-20 NOTE — ED Provider Notes (Signed)
[facility] DEPT Provider Note   CSN: [id number] Arrival date & time: 01/20/22  2020     History  Chief Complaint  Patient presents with   Dysuria    Ann Avery is a 26 y.o. female who presents with 5 days of urinary frequency, urgency and dysuria with burning with urination.  Also states that she has missed her period this month.  States was recently on amoxicillin for dental infection and following that course of antibiotics had a few days of vaginal itching which has not resolved and subsequently started with her urinary symptoms.  History UTI during pregnancy, however that was the only UTI she has had in the past.  I personally reviewed her medical records.  Has history of laboratory bowel disease.  She is not anticoagulated.  HPI     Home Medications Prior to Admission medications   Medication Sig Start Date End Date Taking? Authorizing Provider  sulfamethoxazole-trimethoprim (BACTRIM DS) 800-160 MG tablet Take 1 tablet by mouth 2 (two) times daily for 5 days. 01/20/22 01/25/22 Yes Carlin Attridge, Gypsy Balsam, PA-C  acetaminophen (TYLENOL) 325 MG tablet Take 2 tablets (650 mg total) by mouth every 6 (six) hours as needed. 11/25/21   Jeanell Sparrow, DO  benzonatate (TESSALON) 100 MG capsule Take 1 capsule (100 mg total) by mouth 3 (three) times daily as needed for cough. 04/20/18   Zigmund Gottron, NP  cyclobenzaprine (FLEXERIL) 10 MG tablet Take 1 tablet (10 mg total) by mouth 2 (two) times daily as needed for muscle spasms. 06/16/21   Valarie Merino, MD  guanFACINE (INTUNIV) 4 MG TB24 SR tablet Take 4 mg by mouth daily.    [provider]  ibuprofen (ADVIL) 600 MG tablet Take 1 tablet (600 mg total) by mouth every 8 (eight) hours as needed. 06/16/21   Valarie Merino, MD  levonorgestrel-ethinyl estradiol (AVIANE,ALESSE,LESSINA) 0.1-20 MG-MCG tablet Take 1 tablet by mouth daily.    [provider]  mesalamine (PENTASA) 500 MG CR  capsule Take 500 mg by mouth 2 (two) times daily.     [provider]  methylphenidate (CONCERTA) 36 MG CR tablet Take 36 mg by mouth every morning.    [provider]  ondansetron (ZOFRAN) 4 MG tablet Take 1 tablet (4 mg total) by mouth every 4 (four) hours as needed for nausea or vomiting. 11/25/21   Jeanell Sparrow, DO  phenol (CHLORASEPTIC) 1.4 % LIQD Use as directed 1 spray in the mouth or throat as needed for throat irritation / pain. 04/23/17   Tasia Catchings, Amy V, PA-C  predniSONE (STERAPRED UNI-PAK 21 TAB) 10 MG (21) TBPK tablet Take by mouth daily. Take 6 tabs by mouth daily  for 2 days, then 5 tabs for 2 days, then 4 tabs for 2 days, then 3 tabs for 2 days, 2 tabs for 2 days, then 1 tab by mouth daily for 2 days 11/25/21   Jeanell Sparrow, DO  sodium chloride (OCEAN) 0.65 % SOLN nasal spray Place 1 spray into both nostrils as needed for congestion. 04/20/18   Zigmund Gottron, NP      Allergies    Latex and Shellfish allergy    Review of Systems   Review of Systems  Constitutional: Negative.  Negative for chills, fatigue and fever.  HENT: Negative.    Respiratory: Negative.    Cardiovascular: Negative.   Gastrointestinal: Negative.   Genitourinary:  Positive for dysuria, menstrual problem and urgency. Negative for decreased urine  volume, difficulty urinating, flank pain, hematuria, pelvic pain, vaginal bleeding, vaginal discharge and vaginal pain.    Physical Exam Updated Vital Signs BP (!) 118/100 (BP Location: Left Arm)   Pulse 90   Temp 98.3 F (36.8 C) (Oral)   Resp 17   Ht 5' 7"  (1.702 m)   Wt 64.4 kg   LMP 12/25/2021   SpO2 93%   BMI 22.24 kg/m  Physical Exam Vitals and nursing note reviewed.  Constitutional:      General: She is not in acute distress.    Appearance: She is not ill-appearing or toxic-appearing.  HENT:     Head: Normocephalic and atraumatic.     Mouth/Throat:     Mouth: Mucous membranes are moist.     Pharynx: No oropharyngeal exudate or  posterior oropharyngeal erythema.  Eyes:     General:        Right eye: No discharge.        Left eye: No discharge.     Extraocular Movements: Extraocular movements intact.     Conjunctiva/sclera: Conjunctivae normal.     Pupils: Pupils are equal, round, and reactive to light.  Cardiovascular:     Rate and Rhythm: Normal rate and regular rhythm.     Pulses: Normal pulses.     Heart sounds: Normal heart sounds. No murmur heard. Pulmonary:     Effort: Pulmonary effort is normal. No respiratory distress.     Breath sounds: Normal breath sounds. No wheezing or rales.  Abdominal:     General: Bowel sounds are normal. There is no distension.     Palpations: Abdomen is soft.     Tenderness: There is no abdominal tenderness. There is no right CVA tenderness, left CVA tenderness, guarding or rebound.  Musculoskeletal:        General: No deformity.     Cervical back: Neck supple.     Right lower leg: No edema.     Left lower leg: No edema.  Skin:    General: Skin is warm and dry.     Capillary Refill: Capillary refill takes less than 2 seconds.  Neurological:     General: No focal deficit present.     Mental Status: She is alert and oriented to person, place, and time. Mental status is at baseline.  Psychiatric:        Mood and Affect: Mood normal.     ED Results / Procedures / Treatments   Labs (all labs ordered are listed, but only abnormal results are displayed) Labs Reviewed  URINALYSIS, ROUTINE W REFLEX MICROSCOPIC - Abnormal; Notable for the following components:      Result Value   Hgb urine dipstick SMALL (*)    Bacteria, UA RARE (*)    All other components within normal limits  URINE CULTURE  PREGNANCY, URINE    EKG None  Radiology No results found.  Procedures Procedures    Medications Ordered in ED Medications  sulfamethoxazole-trimethoprim (BACTRIM DS) 800-160 MG per tablet 1 tablet (has no administration in time range)    ED Course/ Medical Decision  Making/ A&P                           Medical Decision Making 26 year old female presents with concern for dysuria, urinary frequency and urgency x 5 days.  Hypertensive on intake vitals otherwise normal.  Cardiopulmonary abdominal exams are benign, no CVAT.  Patient without vaginal bleeding or discharge, per interview.  DDx includes not limited to UTI, pyelonephritis, pregnancy, vulvovaginitis.  Amount and/or Complexity of Data Reviewed Labs: ordered.    Details: UA with scant hemoglobin, rare bacteria, 0-5 white cells, pregnancy test negative.  Urine culture pending.   Clinical picture most concerning for urinary tract infection.  UA reassuring, however given patient's severity of symptoms x 5 days we will proceed with antibiotic therapy at this time.  Urine culture pending.  Clinical concern for emergent underlying etiology that would warrant further ED workup or inpatient management is exceedingly low.  Admission considered, not indicated at this time.  No further workup warranted in the ED this evening.  Maebelle  voiced understanding of her medical evaluation and treatment plan. Each of their questions answered to their expressed satisfaction.  Return precautions were given.  Patient is well-appearing, stable, and was discharged in good condition.  This chart was dictated using voice recognition software, Dragon. Despite the best efforts of this provider to proofread and correct errors, errors may still occur which can change documentation meaning.  Final Clinical Impression(s) / ED Diagnoses Final diagnoses:  Dysuria    Rx / DC Orders ED Discharge Orders          Ordered    sulfamethoxazole-trimethoprim (BACTRIM DS) 800-160 MG tablet  2 times daily        01/20/22 2325              Elijiah Mickley, Gypsy Balsam, PA-C 01/20/22 2325    Orpah Greek, MD 01/21/22 [phone]

## 2022-01-20 NOTE — ED Notes (Signed)
Patient refusing further discharge instructions.

## 2022-01-20 NOTE — ED Provider Triage Note (Signed)
Emergency Medicine Provider Triage Evaluation Note  Ann Avery , a 26 y.o. female  was evaluated in triage.  Pt complains of dysuriaxseveral days. Associated back pain. Just got off amoxicillin per patient. No hx of kidney stones. No vaginal discharge.  Review of Systems  Positive: Dysuria, urgency, frequency Negative: Abdominal pain  Physical Exam  There were no vitals taken for this visit. Gen:   Awake, no distress   Resp:  Normal effort  MSK:   Moves extremities without difficulty  Medical Decision Making  Medically screening exam initiated at 8:47 PM.  Appropriate orders placed.  Ann Avery was informed that the remainder of the evaluation will be completed by another provider, this initial triage assessment does not replace that evaluation, and the importance of remaining in the ED until their evaluation is complete.     Osvaldo Shipper, [place] 01/20/22 2049

## 2022-01-22 LAB — URINE CULTURE: Culture: <10000 — AB

## 2022-05-29 ENCOUNTER — Other Ambulatory Visit: Payer: Self-pay

## 2022-05-29 ENCOUNTER — Emergency Department (HOSPITAL_BASED_OUTPATIENT_CLINIC_OR_DEPARTMENT_OTHER)
Admission: EM | Admit: 2022-05-29 | Discharge: 2022-05-30 | Disposition: A | Discharge status: Home / Self Care | Payer: Managed Care, Other (non HMO) | Attending: Emergency Medicine | Admitting: Emergency Medicine

## 2022-05-29 ENCOUNTER — Encounter (HOSPITAL_BASED_OUTPATIENT_CLINIC_OR_DEPARTMENT_OTHER): Payer: Self-pay | Admitting: Emergency Medicine

## 2022-05-29 DIAGNOSIS — J029 Acute pharyngitis, unspecified: Secondary | ICD-10-CM | POA: Insufficient documentation

## 2022-05-29 DIAGNOSIS — Z91040 Latex allergy status: Secondary | ICD-10-CM | POA: Insufficient documentation

## 2022-05-29 NOTE — ED Triage Notes (Signed)
Sore throat painful swallowing x 1 week

## 2022-05-30 LAB — GROUP A STREP BY PCR: Group A Strep by PCR: NOT DETECTED

## 2022-05-30 MED ORDER — CEPHALEXIN 500 MG PO CAPS: 500.0000 mg | ORAL_CAPSULE | Freq: Three times a day (TID) | ORAL | 0 refills | Status: DC

## 2022-05-30 NOTE — ED Provider Notes (Signed)
[place] [facility]Y [facility]PARTMENT AT [facility] Provider Note   CSN: [id number] Arrival date & time: 05/29/22  2341     History  Chief Complaint  Patient presents with   Sore Throat    Ann Avery is a 27 y.o. female.  Patient is a 27 year old female presenting with complaints of sore throat.  This has been ongoing for the past 2 weeks.  She has been using Chloraseptic spray and other over-the-counter medications with some relief, but symptoms persist.  She denies any fevers or chills.  Pain is worse when she swallows.  The history is provided by the patient.       Home Medications Prior to Admission medications   Medication Sig Start Date End Date Taking? Authorizing Provider  cephALEXin (KEFLEX) 500 MG capsule Take 1 capsule (500 mg total) by mouth 3 (three) times daily. 05/30/22  Yes Kalven Ganim, Riley Lamouglas, MD  acetaminophen (TYLENOL) 325 MG tablet Take 2 tablets (650 mg total) by mouth every 6 (six) hours as needed. 11/25/21   Sloan LeiterGray, Samuel A, DO  benzonatate (TESSALON) 100 MG capsule Take 1 capsule (100 mg total) by mouth 3 (three) times daily as needed for cough. 04/20/18   Georgetta HaberBurky, Natalie B, NP  cyclobenzaprine (FLEXERIL) 10 MG tablet Take 1 tablet (10 mg total) by mouth 2 (two) times daily as needed for muscle spasms. 06/16/21   Wynetta FinesMessick, Peter C, MD  guanFACINE (INTUNIV) 4 MG TB24 SR tablet Take 4 mg by mouth daily.    [provider]  ibuprofen (ADVIL) 600 MG tablet Take 1 tablet (600 mg total) by mouth every 8 (eight) hours as needed. 06/16/21   Wynetta FinesMessick, Peter C, MD  levonorgestrel-ethinyl estradiol (AVIANE,ALESSE,LESSINA) 0.1-20 MG-MCG tablet Take 1 tablet by mouth daily.    [provider]  mesalamine (PENTASA) 500 MG CR capsule Take 500 mg by mouth 2 (two) times daily.     [provider]  methylphenidate (CONCERTA) 36 MG CR tablet Take 36 mg by mouth every morning.    [provider]  ondansetron (ZOFRAN) 4 MG tablet Take 1 tablet (4 mg  total) by mouth every 4 (four) hours as needed for nausea or vomiting. 11/25/21   Sloan LeiterGray, Samuel A, DO  phenol (CHLORASEPTIC) 1.4 % LIQD Use as directed 1 spray in the mouth or throat as needed for throat irritation / pain. 04/23/17   Cathie HoopsYu, Amy V, PA-C  predniSONE (STERAPRED UNI-PAK 21 TAB) 10 MG (21) TBPK tablet Take by mouth daily. Take 6 tabs by mouth daily  for 2 days, then 5 tabs for 2 days, then 4 tabs for 2 days, then 3 tabs for 2 days, 2 tabs for 2 days, then 1 tab by mouth daily for 2 days 11/25/21   Sloan LeiterGray, Samuel A, DO  sodium chloride (OCEAN) 0.65 % SOLN nasal spray Place 1 spray into both nostrils as needed for congestion. 04/20/18   Georgetta HaberBurky, Natalie B, NP      Allergies    Latex and Shellfish allergy    Review of Systems   Review of Systems  All other systems reviewed and are negative.   Physical Exam Updated Vital Signs BP 109/67   Pulse 100   Temp 98.1 F (36.7 C) (Oral)   Resp 18   LMP 05/08/2022   SpO2 100%  Physical Exam Vitals and nursing note reviewed.  Constitutional:      General: She is not in acute distress.    Appearance: She is well-developed. She is not diaphoretic.  HENT:     Head: Normocephalic and atraumatic.     Mouth/Throat:     Mouth: Mucous membranes are moist.     Comments: There is mild erythema of the posterior oropharynx.  Tonsils appear somewhat inflamed, but no exudates. Cardiovascular:     Rate and Rhythm: Normal rate and regular rhythm.     Heart sounds: No murmur heard.    No friction rub. No gallop.  Pulmonary:     Effort: Pulmonary effort is normal. No respiratory distress.     Breath sounds: Normal breath sounds. No wheezing.  Abdominal:     General: Bowel sounds are normal. There is no distension.     Palpations: Abdomen is soft.     Tenderness: There is no abdominal tenderness.  Musculoskeletal:        General: Normal range of motion.     Cervical back: Normal range of motion and neck supple.  Skin:    General: Skin is warm and dry.   Neurological:     General: No focal deficit present.     Mental Status: She is alert and oriented to person, place, and time.     ED Results / Procedures / Treatments   Labs (all labs ordered are listed, but only abnormal results are displayed) Labs Reviewed  GROUP A STREP BY PCR    EKG None  Radiology No results found.  Procedures Procedures    Medications Ordered in ED Medications - No data to display  ED Course/ Medical Decision Making/ A&P  Patient with a 2-week history of sore throat.  She has had no relief with over-the-counter medications.  I will prescribe an antibiotic due to the duration of symptoms and see if this helps.  Patient to follow-up as needed if not improving.  Final Clinical Impression(s) / ED Diagnoses Final diagnoses:  None    Rx / DC Orders ED Discharge Orders          Ordered    cephALEXin (KEFLEX) 500 MG capsule  3 times daily        05/30/22 0056              Geoffery Lyons, MD 05/30/22 [phone]

## 2022-05-30 NOTE — Discharge Instructions (Signed)
Begin taking Keflex as prescribed.  Take ibuprofen 600 mg every 6 hours as needed for pain.  Follow-up with primary doctor if not improving in the next 3 to 4 days.

## 2023-01-28 ENCOUNTER — Emergency Department (HOSPITAL_BASED_OUTPATIENT_CLINIC_OR_DEPARTMENT_OTHER)
Admission: EM | Admit: 2023-01-28 | Discharge: 2023-01-29 | Disposition: A | Discharge status: Home / Self Care | Payer: Managed Care, Other (non HMO) | Attending: Emergency Medicine | Admitting: Emergency Medicine

## 2023-01-28 ENCOUNTER — Other Ambulatory Visit: Payer: Self-pay

## 2023-01-28 ENCOUNTER — Encounter (HOSPITAL_BASED_OUTPATIENT_CLINIC_OR_DEPARTMENT_OTHER): Payer: Self-pay

## 2023-01-28 DIAGNOSIS — Z91040 Latex allergy status: Secondary | ICD-10-CM | POA: Diagnosis not present

## 2023-01-28 DIAGNOSIS — J029 Acute pharyngitis, unspecified: Secondary | ICD-10-CM | POA: Diagnosis present

## 2023-01-28 DIAGNOSIS — Z20822 Contact with and (suspected) exposure to covid-19: Secondary | ICD-10-CM | POA: Diagnosis not present

## 2023-01-28 LAB — RESP PANEL BY RT-PCR (RSV, FLU A&B, COVID)  RVPGX2
Influenza A by PCR: NEGATIVE
Influenza B by PCR: NEGATIVE
Resp Syncytial Virus by PCR: NEGATIVE
SARS Coronavirus 2 by RT PCR: NEGATIVE

## 2023-01-28 LAB — GROUP A STREP BY PCR: Group A Strep by PCR: NOT DETECTED

## 2023-01-28 MED ORDER — LIDOCAINE VISCOUS HCL 2 % MT SOLN
15.0000 mL | Freq: Once | OROMUCOSAL | Status: AC
  Administered 2023-01-28: 15 mL via OROMUCOSAL
  Filled 2023-01-28: qty 15

## 2023-01-28 MED ORDER — IBUPROFEN 600 MG PO TABS: 600.0000 mg | ORAL_TABLET | Freq: Four times a day (QID) | ORAL | 0 refills | Status: DC | PRN

## 2023-01-28 MED ORDER — MENTHOL 3 MG MT LOZG: 1.0000 | LOZENGE | OROMUCOSAL | 0 refills | Status: DC | PRN

## 2023-01-28 MED ORDER — LIDOCAINE VISCOUS HCL 2 % MT SOLN: 15.0000 mL | OROMUCOSAL | 0 refills | Status: DC | PRN

## 2023-01-28 MED ORDER — OXYMETAZOLINE HCL 0.05 % NA SOLN: 1.0000 | Freq: Two times a day (BID) | NASAL | 0 refills | Status: AC

## 2023-01-28 MED ORDER — ACETAMINOPHEN 325 MG PO TABS: 650.0000 mg | ORAL_TABLET | Freq: Four times a day (QID) | ORAL | 0 refills | Status: DC | PRN

## 2023-01-28 MED ORDER — DEXAMETHASONE SODIUM PHOSPHATE 10 MG/ML IJ SOLN
10.0000 mg | Freq: Once | INTRAMUSCULAR | Status: AC
  Administered 2023-01-28: 10 mg
  Filled 2023-01-28: qty 1

## 2023-01-28 MED ORDER — OXYMETAZOLINE HCL 0.05 % NA SOLN: 1.0000 | Freq: Two times a day (BID) | NASAL | 0 refills | Status: DC

## 2023-01-28 MED ORDER — ACETAMINOPHEN 500 MG PO TABS
1000.0000 mg | ORAL_TABLET | Freq: Once | ORAL | Status: AC
  Administered 2023-01-28: 1000 mg via ORAL
  Filled 2023-01-28: qty 2

## 2023-01-28 NOTE — ED Provider Notes (Signed)
[place] EMERGENCY DEPARTMENT AT [facility] Provider Note  CSN: [id number] Arrival date & time: 01/28/23 2240  Chief Complaint(s) No chief complaint on file.  HPI Ann Avery is a 27 y.o. female with past medical history as below, significant for crohn's disease who presents to the ED with complaint of sore throat  Patient report sore throat for the past 2 days, some difficulty swallowing.  Has been tolerating her own secretions, drinking warm tea without much improvement.  Has not tried medications for symptoms.  No sick contacts recent travel.  Denies history of recurrent pharyngitis or laryngitis.  No difficulty breathing, no nausea or vomiting.  No rashes.  No facial swelling or dental pain  Past Medical History Past Medical History:  Diagnosis Date   Crohn disease (HCC)    Ulcerative colitis    Patient Active Problem List   Diagnosis Date Noted   Screening-pulmonary TB 04/21/2016   Home Medication(s) Prior to Admission medications   Medication Sig Start Date End Date Taking? Authorizing Provider  acetaminophen (TYLENOL) 325 MG tablet Take 2 tablets (650 mg total) by mouth every 6 (six) hours as needed. 11/25/21   Sloan Leiter, DO  benzonatate (TESSALON) 100 MG capsule Take 1 capsule (100 mg total) by mouth 3 (three) times daily as needed for cough. 04/20/18   Georgetta Haber, NP  cephALEXin (KEFLEX) 500 MG capsule Take 1 capsule (500 mg total) by mouth 3 (three) times daily. 05/30/22   Geoffery Lyons, MD  cyclobenzaprine (FLEXERIL) 10 MG tablet Take 1 tablet (10 mg total) by mouth 2 (two) times daily as needed for muscle spasms. 06/16/21   Wynetta Fines, MD  guanFACINE (INTUNIV) 4 MG TB24 SR tablet Take 4 mg by mouth daily.    [provider]  ibuprofen (ADVIL) 600 MG tablet Take 1 tablet (600 mg total) by mouth every 8 (eight) hours as needed. 06/16/21   Wynetta Fines, MD  levonorgestrel-ethinyl estradiol (AVIANE,ALESSE,LESSINA) 0.1-20 MG-MCG tablet Take  1 tablet by mouth daily.    [provider]  mesalamine (PENTASA) 500 MG CR capsule Take 500 mg by mouth 2 (two) times daily.     [provider]  methylphenidate (CONCERTA) 36 MG CR tablet Take 36 mg by mouth every morning.    [provider]  ondansetron (ZOFRAN) 4 MG tablet Take 1 tablet (4 mg total) by mouth every 4 (four) hours as needed for nausea or vomiting. 11/25/21   Sloan Leiter, DO  phenol (CHLORASEPTIC) 1.4 % LIQD Use as directed 1 spray in the mouth or throat as needed for throat irritation / pain. 04/23/17   Cathie Hoops, Amy V, PA-C  predniSONE (STERAPRED UNI-PAK 21 TAB) 10 MG (21) TBPK tablet Take by mouth daily. Take 6 tabs by mouth daily  for 2 days, then 5 tabs for 2 days, then 4 tabs for 2 days, then 3 tabs for 2 days, 2 tabs for 2 days, then 1 tab by mouth daily for 2 days 11/25/21   Sloan Leiter, DO  sodium chloride (OCEAN) 0.65 % SOLN nasal spray Place 1 spray into both nostrils as needed for congestion. 04/20/18   Georgetta Haber, NP  Past Surgical History Past Surgical History:  Procedure Laterality Date   CESAREAN SECTION     COLONOSCOPY     Family History Family History  Problem Relation Age of Onset   Hypertension Mother    Diabetes Father     Social History Social History   Tobacco Use   Smoking status: Never   Smokeless tobacco: Never  Substance Use Topics   Alcohol use: No   Drug use: No   Allergies Latex and Shellfish allergy  Review of Systems Review of Systems  Constitutional:  Negative for chills and fever.  HENT:  Positive for postnasal drip and sore throat.   Respiratory:  Negative for shortness of breath.   Cardiovascular:  Negative for chest pain.  Gastrointestinal:  Negative for abdominal pain and vomiting.  Skin:  Negative for rash.  Neurological:  Negative for headaches.  All other  systems reviewed and are negative.   Physical Exam Vital Signs  I have reviewed the triage vital signs BP (!) 123/110 (BP Location: Right Arm)   Pulse 95   Temp 97.8 F (36.6 C)   Resp 16   SpO2 100%  Physical Exam Vitals and nursing note reviewed.  Constitutional:      General: She is not in acute distress.    Appearance: Normal appearance. She is well-developed. She is not ill-appearing.  HENT:     Head: Normocephalic and atraumatic.     Jaw: There is normal jaw occlusion. No trismus.     Comments: No drooling trismus stridor    Right Ear: External ear normal.     Left Ear: External ear normal.     Nose: Nose normal.     Mouth/Throat:     Mouth: Mucous membranes are moist.     Dentition: Normal dentition.     Pharynx: Posterior oropharyngeal erythema present.     Tonsils: No tonsillar abscesses.     Comments: Mild erythema posterior oropharynx, uvula is midline, no evidence tonsillar abscess Eyes:     General: No scleral icterus.       Right eye: No discharge.        Left eye: No discharge.  Cardiovascular:     Rate and Rhythm: Normal rate.  Pulmonary:     Effort: Pulmonary effort is normal. No respiratory distress.     Breath sounds: No stridor.  Abdominal:     General: Abdomen is flat. There is no distension.     Tenderness: There is no guarding.  Musculoskeletal:        General: No deformity.     Cervical back: No rigidity.  Skin:    General: Skin is warm and dry.     Coloration: Skin is not cyanotic, jaundiced or pale.  Neurological:     Mental Status: She is alert and oriented to person, place, and time.     GCS: GCS eye subscore is 4. GCS verbal subscore is 5. GCS motor subscore is 6.  Psychiatric:        Speech: Speech normal.        Behavior: Behavior normal. Behavior is cooperative.     ED Results and Treatments Labs (all labs ordered are listed, but only abnormal results are displayed) Labs Reviewed  GROUP A STREP BY PCR  RESP PANEL BY RT-PCR  (RSV, FLU A&B, COVID)  RVPGX2  Radiology No results found.  Pertinent labs & imaging results that were available during my care of the patient were reviewed by me and considered in my medical decision making (see MDM for details).  Medications Ordered in ED Medications  dexamethasone (DECADRON) injection 10 mg (has no administration in time range)  lidocaine (XYLOCAINE) 2 % viscous mouth solution 15 mL (has no administration in time range)  acetaminophen (TYLENOL) tablet 1,000 mg (has no administration in time range)                                                                                                                                     Procedures Procedures  (including critical care time)  Medical Decision Making / ED Course    Medical Decision Making:    Terria Mathes is a 27 y.o. female with past medical history as below, significant for crohn's disease who presents to the ED with complaint of sore throat. The complaint involves an extensive differential diagnosis and also carries with it a high risk of complications and morbidity.  Serious etiology was considered. Ddx includes but is not limited to: Pharyngitis, laryngitis, viral syndrome, tonsillitis, etc.  Complete initial physical exam performed, notably the patient was in no acute distress, breathing comfortably in ambient air.    Reviewed and confirmed nursing documentation for past medical history, family history, social history.  Vital signs reviewed.    Patient with sore throat for few days, no drooling trismus stridor.  Collect swabs     Brief summary: 27 year old female with sore throat x 2 days.  RVP and strep was negative.  No drooling trismus stridor.  Tolerating secretions.  Denies pregnancy.  Will give oral Decadron, viscous lidocaine, analgesic.  Encouraged supportive care at home  including warm liquids with honey, NSAIDs, Tylenol, follow-up PCP  The patient improved significantly and was discharged in stable condition. Detailed discussions were had with the patient regarding current findings, and need for close f/u with PCP or on call doctor. The patient has been instructed to return immediately if the symptoms worsen in any way for re-evaluation. Patient verbalized understanding and is in agreement with current care plan. All questions answered prior to discharge.                  Additional history obtained: -Additional history obtained from na -External records from outside source obtained and reviewed including: Chart review including previous notes, labs, imaging, consultation notes including  Primary care recommendation, prior ED visits   Lab Tests: -I ordered, reviewed, and interpreted labs.   The pertinent results include:   Labs Reviewed  GROUP A STREP BY PCR  RESP PANEL BY RT-PCR (RSV, FLU A&B, COVID)  RVPGX2    Notable for swabs neg  EKG   EKG Interpretation Date/Time:    Ventricular Rate:    PR Interval:    QRS Duration:    QT Interval:  QTC Calculation:   R Axis:      Text Interpretation:           Imaging Studies ordered: na   Medicines ordered and prescription drug management: Meds ordered this encounter  Medications   dexamethasone (DECADRON) injection 10 mg   lidocaine (XYLOCAINE) 2 % viscous mouth solution 15 mL   acetaminophen (TYLENOL) tablet 1,000 mg    -I have reviewed the patients home medicines and have made adjustments as needed   Consultations Obtained: na   Cardiac Monitoring: Continuous pulse oximetry interpreted by myself, 100% on RA.    Social Determinants of Health:  Diagnosis or treatment significantly limited by social determinants of health: lives at home   Reevaluation: After the interventions noted above, I reevaluated the patient and found that they have improved  Co  morbidities that complicate the patient evaluation  Past Medical History:  Diagnosis Date   Crohn disease (HCC)    Ulcerative colitis       Dispostion: Disposition decision including need for hospitalization was considered, and patient discharged from emergency department.    Final Clinical Impression(s) / ED Diagnoses Final diagnoses:  Pharyngitis, unspecified etiology        Sloan Leiter, DO 01/28/23 2350

## 2023-01-28 NOTE — ED Triage Notes (Signed)
Pt c/o sore throat, swollen tonsils x "a couple days." Denies fever, HA, additional symptoms

## 2023-01-28 NOTE — Discharge Instructions (Addendum)
Drink plenty of liquids over the next few days.  Consider using humidifier in your bedroom.  Consider drinking warm tea with honey to help with your throat discomfort.  Gargle warm salt water 3 times daily and spit.  Take over-the-counter acetaminophen or ibuprofen as needed per instructions on box.  Please follow-up with your PCP for recheck in the next 5 to 7 days  It was a pleasure caring for you today in the emergency department.  Please return to the emergency department for any worsening or worrisome symptoms.

## 2023-01-31 ENCOUNTER — Ambulatory Visit
Admission: EM | Admit: 2023-01-31 | Discharge: 2023-01-31 | Disposition: A | Discharge status: Home / Self Care | Payer: Managed Care, Other (non HMO)

## 2023-01-31 DIAGNOSIS — J321 Chronic frontal sinusitis: Secondary | ICD-10-CM

## 2023-01-31 MED ORDER — TRAMADOL HCL 50 MG PO TABS: 50.0000 mg | ORAL_TABLET | Freq: Four times a day (QID) | ORAL | 0 refills | Status: DC | PRN

## 2023-01-31 MED ORDER — AMOXICILLIN-POT CLAVULANATE 875-125 MG PO TABS: 1.0000 | ORAL_TABLET | Freq: Two times a day (BID) | ORAL | 0 refills | Status: DC

## 2023-01-31 NOTE — Discharge Instructions (Addendum)
Medication as ordered. Tramadol for pain not covered by motrin 3L water daily Ensure adequate nutrition. Follow up with primary for change in migraine medication if needed.

## 2023-01-31 NOTE — ED Provider Notes (Incomplete)
[facility]    CSN: [id number] Arrival date & time: 01/31/23  1434      History   Chief Complaint Chief Complaint  Patient presents with   Headache   Arm Problem    HPI Ann Avery is a 27 y.o. female.    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      Home Medications    Prior to Admission medications   Medication Sig Start Date End Date Taking? Authorizing Provider  amoxicillin (AMOXIL) 875 MG tablet Take 875 mg by mouth 2 (two) times daily. 03/02/17  Yes [provider]  dexmethylphenidate (FOCALIN) 5 MG tablet Take 5 mg by mouth as directed. 01/27/23  Yes [provider]  guanFACINE (INTUNIV) 2 MG TB24 ER tablet Take 2 mg by mouth daily.   Yes [provider]  guanFACINE (INTUNIV) 4 MG TB24 ER tablet Take 4 mg by mouth daily.   Yes [provider]  guanFACINE (TENEX) 2 MG tablet Take 2 mg by mouth at bedtime. 03/29/14  Yes [provider]  lisdexamfetamine (VYVANSE) 60 MG capsule Take 60 mg by mouth every morning. 01/27/23  Yes [provider]   mesalamine (PENTASA) 500 MG CR capsule Take 500 mg by mouth 3 (three) times daily. 10/20/16  Yes [provider]  SUMAtriptan (IMITREX) 100 MG tablet Take 100 mg by mouth as directed. 01/27/23  Yes [provider]  UNABLE TO FIND Med Name: Migraine Rx medicine by provider (starts with L), Name unknown.   Yes [provider]  acetaminophen (TYLENOL) 325 MG tablet Take 2 tablets (650 mg total) by mouth every 6 (six) hours as needed. 01/28/23   Sloan Leiter, DO  amoxicillin (AMOXIL) 500 MG capsule Take 500 mg by mouth 2 (two) times daily.    [provider]  benzonatate (TESSALON) 100 MG capsule Take 1 capsule (100 mg total) by mouth 3 (three) times daily as needed for cough. 04/20/18   Georgetta Haber, NP  betamethasone dipropionate (DIPROLENE) 0.05 % ointment Apply 1 Application topically daily. 07/27/22   [provider]  cephALEXin (KEFLEX) 500 MG capsule Take 1 capsule (500 mg total) by mouth 3 (three) times daily. 05/30/22   Geoffery Lyons, MD  chlorhexidine (PERIDEX) 0.12 % solution Use as directed 15 mLs in the mouth or throat 2 (two) times daily. 07/21/19   [provider]  cyclobenzaprine (FLEXERIL) 10 MG tablet Take 1 tablet (10 mg total) by mouth 2 (two) times daily as needed for muscle spasms. 06/16/21   Wynetta Fines, MD  EPINEPHrine 0.3 mg/0.3 mL IJ  SOAJ injection Inject 0.3 mg into the muscle as needed for anaphylaxis. 07/02/21   [provider]  Ferrous Sulfate (IRON PO) Take 1 tablet by mouth daily.    [provider]  guanFACINE (INTUNIV) 4 MG TB24 SR tablet Take 4 mg by mouth daily.    [provider]  ibuprofen (ADVIL) 600 MG tablet Take 1 tablet (600 mg total) by mouth every 6 (six) hours as needed. 01/28/23   Sloan Leiter, DO  levonorgestrel-ethinyl estradiol (AVIANE,ALESSE,LESSINA) 0.1-20 MG-MCG tablet Take 1 tablet by mouth daily.    [provider]  lidocaine (XYLOCAINE) 2 % solution Use as  directed 15 mLs in the mouth or throat as needed for mouth pain. Gargle and spit 01/28/23   Sloan Leiter, DO  medroxyPROGESTERone Acetate 150 MG/ML SUSY Inject 150 mg into the muscle as directed. 04/15/20   [provider]  menthol-cetylpyridinium (CEPACOL) 3 MG lozenge Take 1 lozenge (3 mg total) by mouth as needed for sore throat. 01/28/23   Sloan Leiter, DO  mesalamine (PENTASA) 500 MG CR capsule Take 500 mg by mouth 2 (two) times daily.     [provider]  methylphenidate (CONCERTA) 36 MG CR tablet Take 36 mg by mouth every morning.    [provider]  ondansetron (ZOFRAN) 4 MG tablet Take 1 tablet (4 mg total) by mouth every 4 (four) hours as needed for nausea or vomiting. 11/25/21   Sloan Leiter, DO  Oxycodone HCl 10 MG TABS Take 1 tablet by mouth every 4 (four) hours as needed. 04/01/20   [provider]  oxymetazoline (AFRIN NASAL SPRAY) 0.05 % nasal spray Place 1 spray into both nostrils 2 (two) times daily for 3 days. 01/28/23 01/31/23  Tanda Rockers A, DO  phenol (CHLORASEPTIC) 1.4 % LIQD Use as directed 1 spray in the mouth or throat as needed for throat irritation / pain. 04/23/17   Cathie Hoops, Amy V, PA-C  predniSONE (STERAPRED UNI-PAK 21 TAB) 10 MG (21) TBPK tablet Take by mouth daily. Take 6 tabs by mouth daily  for 2 days, then 5 tabs for 2 days, then 4 tabs for 2 days, then 3 tabs for 2 days, 2 tabs for 2 days, then 1 tab by mouth daily for 2 days 11/25/21   Sloan Leiter, DO  sodium chloride (OCEAN) 0.65 % SOLN nasal spray Place 1 spray into both nostrils as needed for congestion. 04/20/18   Linus Mako B, NP  triamcinolone cream (KENALOG) 0.1 % Apply 1 Application topically 2 (two) times daily. 03/13/20   [provider]    Family History Family History  Problem Relation Age of Onset   Hypertension Mother    Diabetes Father     Social History Social History   Tobacco Use   Smoking status: Never   Smokeless tobacco: Never  Vaping Use    Vaping status: Never Used  Substance Use Topics   Alcohol use: No   Drug use: No     Allergies   Latex, Shellfish allergy, Shellfish-derived products, and Bee venom   Review of Systems Review of Systems  Neurological:  Positive for headaches.     Physical Exam Triage Vital Signs ED Triage Vitals  Encounter Vitals Group     BP 01/31/23 1514 (!) 89/60     Systolic BP Percentile --      Diastolic BP Percentile --      Pulse Rate 01/31/23 1514 100     Resp 01/31/23  1514 16     Temp 01/31/23 1514 97.9 F (36.6 C)     Temp Source 01/31/23 1514 Oral     SpO2 01/31/23 1514 98 %     Weight 01/31/23 1508 146 lb (66.2 kg)     Height 01/31/23 1508 5\' 8"  (1.727 m)     Head Circumference --      Peak Flow --      Pain Score 01/31/23 1504 10     Pain Loc --      Pain Education --      Exclude from Growth Chart --    No data found.  Updated Vital Signs BP (!) 89/60 (BP Location: Left Arm) Comment: To recheck later during visit or after.  Pulse 100   Temp 97.9 F (36.6 C) (Oral)   Resp 16   Ht 5\' 8"  (1.727 m)   Wt 146 lb (66.2 kg)   LMP 01/11/2023 (Exact Date)   SpO2 98%   BMI 22.20 kg/m   Visual Acuity Right Eye Distance:   Left Eye Distance:   Bilateral Distance:    Right Eye Near:   Left Eye Near:    Bilateral Near:     Physical Exam   UC Treatments / Results  Labs (all labs ordered are listed, but only abnormal results are displayed) Labs Reviewed - No data to display  EKG   Radiology No results found.  Procedures Procedures (including critical care time)  Medications Ordered in UC Medications - No data to display  Initial Impression / Assessment and Plan / UC Course  I have reviewed the triage vital signs and the nursing notes.  Pertinent labs & imaging results that were available during my care of the patient were reviewed by me and considered in my medical decision making (see chart for details).   Patient reporting headache x 1 day.  She  was recently seen in primary care for possible strep throat.  She was treated with steroids.  She is currently having continued frontal sinus pain she is reporting a fluid shift when bending over.  There is pain on palpation.  *** Final Clinical Impressions(s) / UC Diagnoses   Final diagnoses:  None   Discharge Instructions   None    ED Prescriptions   None    PDMP not reviewed this encounter.

## 2023-01-31 NOTE — ED Triage Notes (Signed)
"  My head hurts a lot". "I have a history of migraines and light sensitivity". "My primary provider prescribed something for Migraines but it isn't working". "With this most recent Ha and I am having left arm heaviness and numbness". No chest pain. No sob. No nausea. No vomiting. "My BP at home was low at home today as well".

## 2023-03-12 ENCOUNTER — Encounter (HOSPITAL_BASED_OUTPATIENT_CLINIC_OR_DEPARTMENT_OTHER): Payer: Self-pay

## 2023-03-12 ENCOUNTER — Other Ambulatory Visit: Payer: Self-pay

## 2023-03-12 ENCOUNTER — Emergency Department (HOSPITAL_BASED_OUTPATIENT_CLINIC_OR_DEPARTMENT_OTHER): Payer: Managed Care, Other (non HMO) | Admitting: Radiology

## 2023-03-12 ENCOUNTER — Emergency Department (HOSPITAL_BASED_OUTPATIENT_CLINIC_OR_DEPARTMENT_OTHER)
Admission: EM | Admit: 2023-03-12 | Discharge: 2023-03-12 | Disposition: A | Discharge status: Home / Self Care | Payer: Managed Care, Other (non HMO)

## 2023-03-12 DIAGNOSIS — R059 Cough, unspecified: Secondary | ICD-10-CM | POA: Diagnosis present

## 2023-03-12 DIAGNOSIS — Z20822 Contact with and (suspected) exposure to covid-19: Secondary | ICD-10-CM | POA: Insufficient documentation

## 2023-03-12 DIAGNOSIS — J205 Acute bronchitis due to respiratory syncytial virus: Secondary | ICD-10-CM | POA: Diagnosis not present

## 2023-03-12 DIAGNOSIS — Z91040 Latex allergy status: Secondary | ICD-10-CM | POA: Insufficient documentation

## 2023-03-12 LAB — RESP PANEL BY RT-PCR (RSV, FLU A&B, COVID)  RVPGX2
Influenza A by PCR: NEGATIVE
Influenza B by PCR: NEGATIVE
Resp Syncytial Virus by PCR: POSITIVE — AB
SARS Coronavirus 2 by RT PCR: NEGATIVE

## 2023-03-12 MED ORDER — NAPROXEN 500 MG PO TABS: 500.0000 mg | ORAL_TABLET | Freq: Two times a day (BID) | ORAL | 0 refills | Status: DC

## 2023-03-12 MED ORDER — PSEUDOEPH-BROMPHEN-DM 30-2-10 MG/5ML PO SYRP: 5.0000 mL | ORAL_SOLUTION | Freq: Four times a day (QID) | ORAL | 0 refills | Status: DC | PRN

## 2023-03-12 NOTE — ED Notes (Signed)
Discharge instructions, follow up care, and prescriptions reviewed and explained, pt verbalized understanding. Pt caox4, ambulatory, NAD on d/c.  

## 2023-03-12 NOTE — ED Triage Notes (Signed)
Pt reports she is here today to cough x1 week. Pt reports mild chest discomfort only with coughing. Pt denies any PMH,sob.

## 2023-03-12 NOTE — Discharge Instructions (Signed)
Please take the anti-inflammatories as prescribed.  Take cough medicine as needed.  Follow-up with your doctor.  Return to the ER for worsening symptoms.

## 2023-03-12 NOTE — ED Notes (Signed)
Work excuse extended due to patient working nights.

## 2023-03-12 NOTE — ED Provider Notes (Signed)
[place] EMERGENCY DEPARTMENT AT [facility] Provider Note   CSN: [id number] Arrival date & time: 03/12/23  4098     History  Chief Complaint  Patient presents with   Cough    Ann Avery is a 28 y.o. female.  28 year old female with no reported past medical history presenting to the emergency department today with cough and chest discomfort with coughing.  The patient states has been going now for the past few days.  She states that her daughter was recently sick with RSV.  She states she has been having a nonproductive cough with this.  She denies a history of DVT or pulmonary embolism, recent surgeries, recent travel.  Denies any pleuritic pain.  She has had some chills but denies any fevers.  She came to the ER today for further evaluation regarding this.  She is reporting some nasal congestion as well.   Cough      Home Medications Prior to Admission medications   Medication Sig Start Date End Date Taking? Authorizing Provider  brompheniramine-pseudoephedrine-DM 30-2-10 MG/5ML syrup Take 5 mLs by mouth 4 (four) times daily as needed. 03/12/23  Yes Durwin Glaze, MD  naproxen (NAPROSYN) 500 MG tablet Take 1 tablet (500 mg total) by mouth 2 (two) times daily. 03/12/23  Yes Durwin Glaze, MD  acetaminophen (TYLENOL) 325 MG tablet Take 2 tablets (650 mg total) by mouth every 6 (six) hours as needed. 01/28/23   Sloan Leiter, DO  amoxicillin-clavulanate (AUGMENTIN) 875-125 MG tablet Take 1 tablet by mouth every 12 (twelve) hours. 01/31/23   Blitch, Linde Gillis, NP  betamethasone dipropionate (DIPROLENE) 0.05 % ointment Apply 1 Application topically daily. 07/27/22   [provider]  chlorhexidine (PERIDEX) 0.12 % solution Use as directed 15 mLs in the mouth or throat 2 (two) times daily. 07/21/19   [provider]  cyclobenzaprine (FLEXERIL) 10 MG tablet Take 1 tablet (10 mg total) by mouth 2 (two) times daily as needed for muscle spasms. 06/16/21   Wynetta Fines, MD  EPINEPHrine 0.3 mg/0.3 mL IJ SOAJ injection Inject 0.3 mg into the muscle as needed for anaphylaxis. 07/02/21   [provider]  Ferrous Sulfate (IRON PO) Take 1 tablet by mouth daily.    [provider]  guanFACINE (INTUNIV) 2 MG TB24 ER tablet Take 2 mg by mouth daily.    [provider]  guanFACINE (INTUNIV) 4 MG TB24 ER tablet Take 4 mg by mouth daily.    [provider]  ibuprofen (ADVIL) 600 MG tablet Take 1 tablet (600 mg total) by mouth every 6 (six) hours as needed. 01/28/23   Sloan Leiter, DO  lidocaine (XYLOCAINE) 2 % solution Use as directed 15 mLs in the mouth or throat as needed for mouth pain. Gargle and spit 01/28/23   Sloan Leiter, DO  lisdexamfetamine (VYVANSE) 60 MG capsule Take 60 mg by mouth every morning. 01/27/23   [provider]  medroxyPROGESTERone Acetate 150 MG/ML SUSY Inject 150 mg into the muscle as directed. 04/15/20   [provider]  menthol-cetylpyridinium (CEPACOL) 3 MG lozenge Take 1 lozenge (3 mg total) by mouth as needed for sore throat. 01/28/23   Sloan Leiter, DO  mesalamine (PENTASA) 500 MG CR capsule Take 500 mg by mouth 2 (two) times daily.     [provider]  ondansetron (ZOFRAN) 4 MG tablet Take 1 tablet (4 mg total) by mouth every 4 (four) hours as needed for nausea or  vomiting. 11/25/21   Sloan Leiter, DO  phenol (CHLORASEPTIC) 1.4 % LIQD Use as directed 1 spray in the mouth or throat as needed for throat irritation / pain. 04/23/17   Cathie Hoops, Amy V, PA-C  sodium chloride (OCEAN) 0.65 % SOLN nasal spray Place 1 spray into both nostrils as needed for congestion. 04/20/18   Georgetta Haber, NP  SUMAtriptan (IMITREX) 100 MG tablet Take 100 mg by mouth as directed. 01/27/23   [provider]  traMADol (ULTRAM) 50 MG tablet Take 1 tablet (50 mg total) by mouth every 6 (six) hours as needed. 01/31/23   Blitch, Linde Gillis, NP  triamcinolone cream (KENALOG) 0.1 % Apply 1 Application  topically 2 (two) times daily. 03/13/20   [provider]      Allergies    Latex, Shellfish allergy, Shellfish-derived products, and Bee venom    Review of Systems   Review of Systems  Respiratory:  Positive for cough.   All other systems reviewed and are negative.   Physical Exam Updated Vital Signs BP (!) 118/57 (BP Location: Right Arm)   Pulse 80   Temp 98 F (36.7 C) (Oral)   Resp 18   Ht 5\' 7"  (1.702 m)   Wt 64.4 kg   LMP 03/07/2023   SpO2 100%   BMI 22.24 kg/m  Physical Exam Vitals and nursing note reviewed.   Gen: NAD Eyes: PERRL, EOMI HEENT: no oropharyngeal swelling Neck: trachea midline Resp: clear to auscultation bilaterally Card: RRR, no murmurs, rubs, or gallops Abd: nontender, nondistended Extremities: no calf tenderness, no edema Vascular: 2+ radial pulses bilaterally, 2+ DP pulses bilaterally Skin: no rashes Psyc: acting appropriately   ED Results / Procedures / Treatments   Labs (all labs ordered are listed, but only abnormal results are displayed) Labs Reviewed  RESP PANEL BY RT-PCR (RSV, FLU A&B, COVID)  [id number] - Abnormal; Notable for the following components:      Result Value   Resp Syncytial Virus by PCR POSITIVE (*)    All other components within normal limits    EKG EKG Interpretation Date/Time:  Friday March 12 2023 06:59:46 EST Ventricular Rate:  81 PR Interval:  130 QRS Duration:  82 QT Interval:  360 QTC Calculation: 418 R Axis:   103  Text Interpretation: Normal sinus rhythm Lateral infarct , age undetermined Abnormal ECG No previous ECGs available Confirmed by Beckey Downing [phone]) on 03/12/2023 7:07:46 AM  Radiology DG Chest 2 View Result Date: 03/12/2023 CLINICAL DATA:  28 year old female with history of cough for 1 week. EXAM: CHEST - 2 VIEW COMPARISON:  Chest x-ray 08/12/2010. FINDINGS: Lung volumes are normal. No consolidative airspace disease. No pleural effusions. No pneumothorax. No pulmonary nodule or mass  noted. Pulmonary vasculature and the cardiomediastinal silhouette are within normal limits. Pectus excavatum. IMPRESSION: 1.  No radiographic evidence of acute cardiopulmonary disease. Electronically Signed   By: Trudie Reed M.D.   On: 03/12/2023 07:43    Procedures Procedures    Medications Ordered in ED Medications - No data to display  ED Course/ Medical Decision Making/ A&P                                 Medical Decision Making 28 year old female with no reported past medical history presenting to the emergency department today with chest discomfort with coughing.  Her chest x-ray is unremarkable.  EKG shows no acute ischemic changes and no  findings concerning for myocarditis or pericarditis.  Suspicion for DVT or pulmonary embolism is low based on description of her symptoms.  Will obtain an RSV/COVID/flu swab on the patient.  I will reevaluate for ultimate disposition.  The patient's RSV swab is positive.  I will treat the patient symptomatically here.  She does not have any significant wheezing here on exam.  She will be treated with NSAIDs as well as cough medication and will be discharged with return precautions.  Amount and/or Complexity of Data Reviewed Radiology: ordered.          Final Clinical Impression(s) / ED Diagnoses Final diagnoses:  RSV bronchitis    Rx / DC Orders ED Discharge Orders          Ordered    naproxen (NAPROSYN) 500 MG tablet  2 times daily        03/12/23 0757    brompheniramine-pseudoephedrine-DM 30-2-10 MG/5ML syrup  4 times daily PRN        03/12/23 0757              Durwin Glaze, MD 03/12/23 [phone]

## 2024-02-25 ENCOUNTER — Encounter (HOSPITAL_COMMUNITY): Payer: Self-pay | Admitting: Emergency Medicine

## 2024-02-25 ENCOUNTER — Emergency Department (HOSPITAL_COMMUNITY)

## 2024-02-25 ENCOUNTER — Other Ambulatory Visit: Payer: Self-pay

## 2024-02-25 ENCOUNTER — Emergency Department (HOSPITAL_COMMUNITY)
Admission: EM | Admit: 2024-02-25 | Discharge: 2024-02-26 | Disposition: A | Discharge status: Other Acute Inpatient Hospital | Source: Home / Self Care | Attending: Emergency Medicine | Admitting: Emergency Medicine

## 2024-02-25 DIAGNOSIS — Z91040 Latex allergy status: Secondary | ICD-10-CM | POA: Insufficient documentation

## 2024-02-25 DIAGNOSIS — F909 Attention-deficit hyperactivity disorder, unspecified type: Secondary | ICD-10-CM | POA: Insufficient documentation

## 2024-02-25 DIAGNOSIS — R45851 Suicidal ideations: Secondary | ICD-10-CM | POA: Insufficient documentation

## 2024-02-25 DIAGNOSIS — F900 Attention-deficit hyperactivity disorder, predominantly inattentive type: Secondary | ICD-10-CM | POA: Diagnosis present

## 2024-02-25 DIAGNOSIS — R1013 Epigastric pain: Secondary | ICD-10-CM | POA: Insufficient documentation

## 2024-02-25 DIAGNOSIS — F39 Unspecified mood [affective] disorder: Secondary | ICD-10-CM | POA: Insufficient documentation

## 2024-02-25 LAB — COMPREHENSIVE METABOLIC PANEL WITH GFR
ALT: 8 U/L (ref 0–44)
AST: 25 U/L (ref 15–41)
Albumin: 4.5 g/dL (ref 3.5–5.0)
Alkaline Phosphatase: 76 U/L (ref 38–126)
Anion gap: 14 (ref 5–15)
BUN: 6 mg/dL (ref 6–20)
CO2: 20 mmol/L — ABNORMAL LOW (ref 22–32)
Calcium: 9 mg/dL (ref 8.9–10.3)
Chloride: 105 mmol/L (ref 98–111)
Creatinine, Ser: 0.94 mg/dL (ref 0.44–1.00)
GFR, Estimated: >60 mL/min
Glucose, Bld: 86 mg/dL (ref 70–99)
Potassium: 3.6 mmol/L (ref 3.5–5.1)
Sodium: 138 mmol/L (ref 135–145)
Total Bilirubin: 0.5 mg/dL (ref 0.0–1.2)
Total Protein: 8.6 g/dL — ABNORMAL HIGH (ref 6.5–8.1)

## 2024-02-25 LAB — CBC
HCT: 34 % — ABNORMAL LOW (ref 36.0–46.0)
Hemoglobin: 10.7 g/dL — ABNORMAL LOW (ref 12.0–15.0)
MCH: 24.7 pg — ABNORMAL LOW (ref 26.0–34.0)
MCHC: 31.5 g/dL (ref 30.0–36.0)
MCV: 78.3 fL — ABNORMAL LOW (ref 80.0–100.0)
Platelets: 422 K/uL — ABNORMAL HIGH (ref 150–400)
RBC: 4.34 MIL/uL (ref 3.87–5.11)
RDW: 16.8 % — ABNORMAL HIGH (ref 11.5–15.5)
WBC: 6.5 K/uL (ref 4.0–10.5)
nRBC: 0 % (ref 0.0–0.2)

## 2024-02-25 LAB — ETHANOL: Alcohol, Ethyl (B): <15 mg/dL

## 2024-02-25 LAB — HCG, SERUM, QUALITATIVE: Preg, Serum: NEGATIVE

## 2024-02-25 MED ORDER — ONDANSETRON 4 MG PO TBDP
8.0000 mg | ORAL_TABLET | Freq: Once | ORAL | Status: AC
  Administered 2024-02-25: 8 mg via ORAL
  Filled 2024-02-25: qty 2

## 2024-02-25 MED ORDER — HYDROCODONE-ACETAMINOPHEN 5-325 MG PO TABS
1.0000 | ORAL_TABLET | Freq: Once | ORAL | Status: AC
  Administered 2024-02-25: 1 via ORAL
  Filled 2024-02-25: qty 1

## 2024-02-25 MED ORDER — IOHEXOL 350 MG/ML SOLN
75.0000 mL | Freq: Once | INTRAVENOUS | Status: AC | PRN
  Administered 2024-02-25: 75 mL via INTRAVENOUS

## 2024-02-25 MED ORDER — OXYCODONE-ACETAMINOPHEN 5-325 MG PO TABS
1.0000 | ORAL_TABLET | Freq: Once | ORAL | Status: AC
  Administered 2024-02-25: 1 via ORAL
  Filled 2024-02-25: qty 1

## 2024-02-25 NOTE — ED Triage Notes (Signed)
 PT BIB police from home with complains ot crohns disease flare up. Also complains of hallucinations and SI due to the excessive pain. PT crying in triage. Pt voluntary at this time and requesting help. PT states she didn't harm herself because she has a a child and wouldn't want to do it with child present. When asked if she has a plan pt states that boyfriend has a gun. Police officers state that pt called for herself and asked them to bring her to the ED. Pt has been cooperative

## 2024-02-25 NOTE — ED Provider Notes (Addendum)
 " [place] EMERGENCY DEPARTMENT AT [facility] Provider Note   CSN: [id number] Arrival date & time: 02/25/24  8087     Patient presents with: Suicidal and Abdominal Pain   Ann Avery is a 29 y.o. female.   29 yo F w/ h/o crohns on pentasa who presents to the ED for abdominal pain and suicidality. Patient has had crohn's for awhile, last colonoscopy this year and nromal. No h/o complications from crohns. Abdomen has been hurting for a month and it is making her depressed and think more about suicide which worries her because she has access to firearms. Still suicidal at this time. Voluntary.    Abdominal Pain      Prior to Admission medications  Medication Sig Start Date End Date Taking? Authorizing Provider  dexmethylphenidate (FOCALIN) 2.5 MG tablet Take 2.5 mg by mouth daily.   Yes [provider]  guanFACINE  (INTUNIV ) 4 MG TB24 ER tablet Take 4 mg by mouth daily.   Yes [provider]  lisdexamfetamine (VYVANSE) 60 MG capsule Take 60 mg by mouth every morning. 01/27/23  Yes [provider]  mesalamine (PENTASA) 500 MG CR capsule Take 500 mg by mouth in the morning and at bedtime.   Yes [provider]    Allergies: Latex, Shellfish allergy, Shellfish protein-containing drug products, and Bee venom    Review of Systems  Gastrointestinal:  Positive for abdominal pain.    Updated Vital Signs BP 110/74 (BP Location: Right Arm)   Pulse 100   Temp 98.2 F (36.8 C) (Oral)   Resp (!) 22   Wt 64 kg   LMP 02/25/2024 (Exact Date)   SpO2 100%   BMI 22.10 kg/m   Physical Exam Vitals and nursing note reviewed.  Constitutional:      Appearance: She is well-developed.  HENT:     Head: Normocephalic and atraumatic.  Cardiovascular:     Rate and Rhythm: Normal rate and regular rhythm.  Pulmonary:     Effort: No respiratory distress.     Breath sounds: No stridor.  Abdominal:     General: There is no distension.      Tenderness: There is abdominal tenderness in the epigastric area.  Musculoskeletal:     Cervical back: Normal range of motion.  Neurological:     Mental Status: She is alert.     (all labs ordered are listed, but only abnormal results are displayed) Labs Reviewed  COMPREHENSIVE METABOLIC PANEL WITH GFR - Abnormal; Notable for the following components:      Result Value   CO2 20 (*)    Total Protein 8.6 (*)    All other components within normal limits  CBC - Abnormal; Notable for the following components:   Hemoglobin 10.7 (*)    HCT 34.0 (*)    MCV 78.3 (*)    MCH 24.7 (*)    RDW 16.8 (*)    Platelets 422 (*)    All other components within normal limits  ETHANOL  HCG, SERUM, QUALITATIVE  URINE DRUG SCREEN    EKG: None  Radiology: CT ABDOMEN PELVIS W CONTRAST Result Date: 02/25/2024 CLINICAL DATA:  Nonlocalized abdomen pain EXAM: CT ABDOMEN AND PELVIS WITH CONTRAST TECHNIQUE: Multidetector CT imaging of the abdomen and pelvis was performed using the standard protocol following bolus administration of intravenous contrast. RADIATION DOSE REDUCTION: This exam was performed according to the departmental dose-optimization program which includes automated exposure control, adjustment of the mA and/or kV according to patient  size and/or use of iterative reconstruction technique. CONTRAST:  75mL OMNIPAQUE  IOHEXOL  350 MG/ML SOLN COMPARISON:  CT 11/25/2021 FINDINGS: Lower chest: Lung bases are clear Hepatobiliary: No focal liver abnormality is seen. No gallstones, gallbladder wall thickening, or biliary dilatation. Pancreas: Unremarkable. No pancreatic ductal dilatation or surrounding inflammatory changes. Spleen: Normal in size without focal abnormality. Adrenals/Urinary Tract: Adrenal glands are unremarkable. Kidneys are normal, without renal calculi, focal lesion, or hydronephrosis. Bladder is unremarkable. Stomach/Bowel: Stomach is within normal limits. Appendix appears normal. No evidence  of bowel wall thickening, distention, or inflammatory changes. Vascular/Lymphatic: No significant vascular findings are present. No enlarged abdominal or pelvic lymph nodes. Reproductive: Uterus and bilateral adnexa are unremarkable. Other: No ascites or free air Musculoskeletal: No acute or suspicious osseous abnormality IMPRESSION: No CT evidence for acute intra-abdominal or pelvic abnormality. Electronically Signed   By: Luke Bun M.D.   On: 02/25/2024 23:14     Procedures   Medications Ordered in the ED  HYDROcodone -acetaminophen  (NORCO/VICODIN) 5-325 MG per tablet 1 tablet (1 tablet Oral Given 02/25/24 2144)  ondansetron  (ZOFRAN -ODT) disintegrating tablet 8 mg (8 mg Oral Given 02/25/24 2144)  iohexol  (OMNIPAQUE ) 350 MG/ML injection 75 mL (75 mLs Intravenous Contrast Given 02/25/24 2213)  oxyCODONE -acetaminophen  (PERCOCET/ROXICET) 5-325 MG per tablet 1 tablet (1 tablet Oral Given 02/25/24 2348)  ketorolac  (TORADOL ) 30 MG/ML injection 15 mg (15 mg Intravenous Given 02/26/24 0028)                                    Medical Decision Making Amount and/or Complexity of Data Reviewed Labs: ordered. ECG/medicine tests: ordered.  Risk Prescription drug management.   Crohn's focused workup is reassuring, will treat symptoms.   Still requesting to speak to a mental health provider and as she has chronic disease and lingering suicidal thoughts, will hold for same. I would consider her medically stable for TTS consultation and recommendations.   Evaluated by psychiatry, Dr. Drury:   Hello, Dr. Lorette and staff, this is Dr. Loree Drury from [facility]. Just finished speaking with the patient. She still appears depressed and fragile, and she is still thinking about shooting herself with her boyfriend's gun. She is willing to come in voluntarily. I will be putting in my note, however, that if she were to request to leave AMA, she meets IVC criteria and an IVC should be begun in such a case.  She's on both Vyvanse and Focalin. Im going to hold on those, given her mood lability. Will put her on Intuniv , and will give you stepwise PRN's. I'll be writing my note, so if you have any questions please don't hesitate to contact me.    Final diagnoses:  Suicidal ideation    ED Discharge Orders     None          Suman Trivedi, Selinda, MD 02/26/24 0222  Patient accepted to [facility], Dr. Drury pending ECG.   Ecg normal. EMTALA filled out. Stable/ready for transport.     Korianna Washer, Selinda, MD 02/26/24 [phone]  "

## 2024-02-25 NOTE — ED Notes (Signed)
 Pt valuables given to security, belongings placed in locker 2.

## 2024-02-25 NOTE — ED Provider Triage Note (Signed)
 Emergency Medicine Provider Triage Evaluation Note  Ann Avery , a 29 y.o. female  was evaluated in triage.  Pt complains of abdominal pain and SI. Has Crohns. Having passive thought of suicide. Did think about using her boyfriends gun. Abdominal is all over. Denies NVD but states no BM since Monday and not really passing gas.  Review of Systems  Positive: See above Negative: See above  Physical Exam  BP 110/74 (BP Location: Right Arm)   Pulse 100   Temp 98.2 F (36.8 C) (Oral)   Resp (!) 22   Wt 64 kg   SpO2 100%   BMI 22.10 kg/m  Gen:   Awake, no distress   Resp:  Normal effort  MSK:   Moves extremities without difficulty  Other:    Medical Decision Making  Medically screening exam initiated at 9:24 PM.  Appropriate orders placed.  Ann Avery was informed that the remainder of the evaluation will be completed by another provider, this initial triage assessment does not replace that evaluation, and the importance of remaining in the ED until their evaluation is complete.  Work up started   Lang Norleen POUR, PA-C 02/25/24 2126

## 2024-02-25 NOTE — ED Triage Notes (Signed)
 Pt awaken to get IV started and to give medicaiton. Pt states she hasn't slept in a few days due to daughter being sick. States she is feeling less anxious and very thankful that she was able to sleep for a few minutes in triage. Denies any needs at this time.

## 2024-02-26 ENCOUNTER — Inpatient Hospital Stay (HOSPITAL_COMMUNITY)
Admission: AD | Admit: 2024-02-26 | Discharge: 2024-02-29 | DRG: 882 | Disposition: A | Discharge status: Home / Self Care | Source: Intra-hospital

## 2024-02-26 ENCOUNTER — Encounter (HOSPITAL_COMMUNITY): Payer: Self-pay | Admitting: Psychiatry

## 2024-02-26 DIAGNOSIS — R45851 Suicidal ideations: Secondary | ICD-10-CM | POA: Diagnosis present

## 2024-02-26 DIAGNOSIS — Z8249 Family history of ischemic heart disease and other diseases of the circulatory system: Secondary | ICD-10-CM | POA: Diagnosis not present

## 2024-02-26 DIAGNOSIS — Z833 Family history of diabetes mellitus: Secondary | ICD-10-CM

## 2024-02-26 DIAGNOSIS — Z91013 Allergy to seafood: Secondary | ICD-10-CM | POA: Diagnosis not present

## 2024-02-26 DIAGNOSIS — F39 Unspecified mood [affective] disorder: Secondary | ICD-10-CM

## 2024-02-26 DIAGNOSIS — K5090 Crohn's disease, unspecified, without complications: Secondary | ICD-10-CM | POA: Diagnosis present

## 2024-02-26 DIAGNOSIS — F909 Attention-deficit hyperactivity disorder, unspecified type: Secondary | ICD-10-CM | POA: Diagnosis not present

## 2024-02-26 DIAGNOSIS — F900 Attention-deficit hyperactivity disorder, predominantly inattentive type: Secondary | ICD-10-CM | POA: Diagnosis present

## 2024-02-26 DIAGNOSIS — G4700 Insomnia, unspecified: Secondary | ICD-10-CM | POA: Diagnosis present

## 2024-02-26 DIAGNOSIS — J101 Influenza due to other identified influenza virus with other respiratory manifestations: Secondary | ICD-10-CM | POA: Diagnosis present

## 2024-02-26 DIAGNOSIS — Z91030 Bee allergy status: Secondary | ICD-10-CM | POA: Diagnosis not present

## 2024-02-26 DIAGNOSIS — Z1152 Encounter for screening for COVID-19: Secondary | ICD-10-CM

## 2024-02-26 DIAGNOSIS — F4323 Adjustment disorder with mixed anxiety and depressed mood: Principal | ICD-10-CM | POA: Diagnosis present

## 2024-02-26 DIAGNOSIS — Z91040 Latex allergy status: Secondary | ICD-10-CM

## 2024-02-26 DIAGNOSIS — Z79899 Other long term (current) drug therapy: Secondary | ICD-10-CM | POA: Diagnosis not present

## 2024-02-26 LAB — URINE DRUG SCREEN
Amphetamines: POSITIVE — AB
Barbiturates: NEGATIVE
Benzodiazepines: NEGATIVE
Cocaine: NEGATIVE
Fentanyl: NEGATIVE
Methadone Scn, Ur: NEGATIVE
Opiates: POSITIVE — AB
Tetrahydrocannabinol: POSITIVE — AB

## 2024-02-26 LAB — RESP PANEL BY RT-PCR (RSV, FLU A&B, COVID)  RVPGX2
Influenza A by PCR: POSITIVE — AB
Influenza B by PCR: NEGATIVE
Resp Syncytial Virus by PCR: NEGATIVE
SARS Coronavirus 2 by RT PCR: NEGATIVE

## 2024-02-26 MED ORDER — DIPHENHYDRAMINE HCL 25 MG PO CAPS: 50.0000 mg | ORAL_CAPSULE | Freq: Three times a day (TID) | ORAL | Status: DC | PRN

## 2024-02-26 MED ORDER — DIPHENHYDRAMINE HCL 50 MG/ML IJ SOLN: 50.0000 mg | Freq: Three times a day (TID) | INTRAMUSCULAR | Status: DC | PRN

## 2024-02-26 MED ORDER — HYDROXYZINE HCL 25 MG PO TABS
25.0000 mg | ORAL_TABLET | Freq: Three times a day (TID) | ORAL | Status: DC | PRN
  Administered 2024-02-27 – 2024-02-28 (×2): 25 mg via ORAL
  Filled 2024-02-26 (×3): qty 1

## 2024-02-26 MED ORDER — HYDROXYZINE HCL 50 MG PO TABS
50.0000 mg | ORAL_TABLET | Freq: Four times a day (QID) | ORAL | Status: DC | PRN
  Administered 2024-02-26: 50 mg via ORAL
  Filled 2024-02-26: qty 2

## 2024-02-26 MED ORDER — ALUM & MAG HYDROXIDE-SIMETH 200-200-20 MG/5ML PO SUSP: 30.0000 mL | ORAL | Status: DC | PRN

## 2024-02-26 MED ORDER — KETOROLAC TROMETHAMINE 30 MG/ML IJ SOLN
15.0000 mg | Freq: Once | INTRAMUSCULAR | Status: AC
  Administered 2024-02-26: 15 mg via INTRAVENOUS
  Filled 2024-02-26: qty 1

## 2024-02-26 MED ORDER — ACETAMINOPHEN 325 MG PO TABS
650.0000 mg | ORAL_TABLET | Freq: Four times a day (QID) | ORAL | Status: DC | PRN
  Administered 2024-02-27: 650 mg via ORAL
  Filled 2024-02-26: qty 2

## 2024-02-26 MED ORDER — OSELTAMIVIR PHOSPHATE 75 MG PO CAPS
75.0000 mg | ORAL_CAPSULE | Freq: Once | ORAL | Status: AC
  Administered 2024-02-26: 75 mg via ORAL
  Filled 2024-02-26: qty 1

## 2024-02-26 MED ORDER — GUANFACINE HCL ER 1 MG PO TB24
4.0000 mg | ORAL_TABLET | Freq: Every day | ORAL | Status: DC
  Filled 2024-02-26: qty 4

## 2024-02-26 MED ORDER — LORAZEPAM 2 MG/ML IJ SOLN: 2.0000 mg | Freq: Three times a day (TID) | INTRAMUSCULAR | Status: DC | PRN

## 2024-02-26 MED ORDER — HALOPERIDOL 5 MG PO TABS: 5.0000 mg | ORAL_TABLET | Freq: Three times a day (TID) | ORAL | Status: DC | PRN

## 2024-02-26 MED ORDER — ACETAMINOPHEN 325 MG PO TABS
650.0000 mg | ORAL_TABLET | Freq: Once | ORAL | Status: AC
  Administered 2024-02-26: 650 mg via ORAL
  Filled 2024-02-26: qty 2

## 2024-02-26 MED ORDER — OLANZAPINE 5 MG PO TBDP: 5.0000 mg | ORAL_TABLET | Freq: Four times a day (QID) | ORAL | Status: DC | PRN

## 2024-02-26 MED ORDER — OSELTAMIVIR PHOSPHATE 75 MG PO CAPS: 75.0000 mg | ORAL_CAPSULE | Freq: Two times a day (BID) | ORAL | 0 refills | Status: DC

## 2024-02-26 MED ORDER — HALOPERIDOL LACTATE 5 MG/ML IJ SOLN: 5.0000 mg | Freq: Three times a day (TID) | INTRAMUSCULAR | Status: DC | PRN

## 2024-02-26 MED ORDER — LORAZEPAM 2 MG/ML IJ SOLN: 2.0000 mg | Freq: Four times a day (QID) | INTRAMUSCULAR | Status: DC | PRN

## 2024-02-26 MED ORDER — MAGNESIUM HYDROXIDE 400 MG/5ML PO SUSP: 30.0000 mL | Freq: Every day | ORAL | Status: DC | PRN

## 2024-02-26 MED ORDER — HALOPERIDOL LACTATE 5 MG/ML IJ SOLN: 10.0000 mg | Freq: Three times a day (TID) | INTRAMUSCULAR | Status: DC | PRN

## 2024-02-26 MED ORDER — ZIPRASIDONE MESYLATE 20 MG IM SOLR: 5.0000 mg | Freq: Four times a day (QID) | INTRAMUSCULAR | Status: DC | PRN

## 2024-02-26 MED ORDER — TRAZODONE HCL 50 MG PO TABS
50.0000 mg | ORAL_TABLET | Freq: Every evening | ORAL | Status: DC | PRN
  Administered 2024-02-28: 50 mg via ORAL
  Filled 2024-02-26: qty 1

## 2024-02-26 NOTE — ED Provider Notes (Signed)
 Emergency Medicine Observation Re-evaluation Note  Ann Avery is a 29 y.o. female, seen on rounds today.  Pt initially presented to the ED for complaints of Suicidal and Abdominal Pain Currently, the patient is resting in the room.  Physical Exam  BP (!) 102/56   Pulse (!) 107   Temp (!) 100.7 F (38.2 C) (Oral)   Resp 18   Wt 64 kg   LMP 02/25/2024 (Exact Date)   SpO2 100%   BMI 22.10 kg/m  Physical Exam General: resting comfortably, NAD Lungs: normal WOB Psych: currently calm and resting  ED Course / MDM  EKG:EKG Interpretation Date/Time:  Saturday February 26 2024 04:13:36 EST Ventricular Rate:  73 PR Interval:  120 QRS Duration:  82 QT Interval:  376 QTC Calculation: 414 R Axis:   82  Text Interpretation: Normal sinus rhythm Normal ECG When compared with ECG of 12-Mar-2023 06:59, PREVIOUS ECG IS PRESENT Confirmed by Lorette Mayo [phone]) on 02/26/2024 4:17:50 AM  I have reviewed the labs performed to date as well as medications administered while in observation.  Recent changes in the last 24 hours include accepted to [facility].  Plan  Current plan is for transfer to [facility], patient accepted.  Stable at time of transfer, EMTALA completed per request.    Ann Avery HERO, DO 02/26/24 1102

## 2024-02-26 NOTE — Consult Note (Signed)
 [facility] Consult Note  Patient Name: Ann Avery MRN: [id number] DOB: [birth year] DATE OF Consult: 02/26/2024 Consult Order details:  Orders (From admission, onward)     Start     Ordered   02/25/24 2328  CONSULT TO CALL ACT TEAM       Ordering Provider: Lorette Mayo, MD  Provider:  (Not yet assigned)  Question:  Reason for Consult?  Answer:  Psych consult   02/25/24 2327   02/25/24 2328  IP CONSULT TO PSYCHIATRY       Ordering Provider: Lorette Mayo, MD  Provider:  (Not yet assigned)  Question:  Reason for consult:  Answer:  Medication management   02/25/24 2327            PRIMARY PSYCHIATRIC DIAGNOSES   1.  Mood Disorder 2.  Attention Deficit Disorder, Childhood Onset   RECOMMENDATIONS  Recommendations: Medication recommendations: Continue Intuniv  4 mg at bedtime for anxiety/attention.  For now, will hold on her Focalin (had been 2.5 mg every day) and Vyvanse (had been 10 mg every day, down from 60 mg every day, which patient had found to high) for now, given the history of significant mood issues.  This can be re-evaluated by inpatient team.  PRN's:  Hydroxyzine , 50 mg q6h PRN anxiety; Zyprexa  Zydis, 5 mg SL q6h PRN agitation;  For severe agitation/aggression, Geodon , 5 mg IM q6h PRN and Ativan , 2 mg IM q6h PRN. Non-Medication/therapeutic recommendations: Patient still with suicidal ideation, and recommend that she continue on close observation until she can be safely admitted to Psychiatry.  Continue with matter-of-fact emotional support in ED, pending transfer   Is inpatient psychiatric hospitalization recommended for this patient? Yes (Explain why): Patient is quite depressed, and she admits to marked mood swings. Still with suicidal thoughts, specifically of using her boyfriend's gun.  She meets admission criteria, and she is willing to come in voluntarily.  HOWEVER, if patient were to request to leave AMA, she does meet IVC criteria.  In such a case, patient  should not be allowed to sign out, and IVC paperwork should be filed. Is another care setting recommended for this patient? (examples may include Crisis Stabilization Unit, Residential/Recovery Treatment, ALF/SNF, Memory Care Unit)  No (Explain why): As above From a psychiatric perspective, is this patient appropriate for discharge to an outpatient setting/resource or other less restrictive environment for continued care?  No (Explain why): As above Follow-Up Telepsychiatry C/L services: We will sign off for now. Please re-consult our service if needed for any concerning changes in the patient's condition, discharge planning, or questions. Communication: Treatment team members (and family members if applicable) who were involved in treatment/care discussions and planning, and with whom we spoke or engaged with via secure text/chat, include the following: Secure message sent to Dr. Lorette, ED attending, and staff, outlining recommendations  I personally spent a total of 30 minutes in the care of the patient today including preparing to see the patient, getting/reviewing separately obtained history, performing a medically appropriate exam/evaluation, counseling and educating, placing orders, referring and communicating with other health care professionals, documenting clinical information in the EHR, and independently interpreting results.  Thank you for involving us  in the care of this patient. If you have any additional questions or concerns, please call [phone] and ask for the provider on-call.   TELEPSYCHIATRY ATTESTATION & CONSENT   As the provider for this telehealth consult, I attest that I verified the patients identity using two separate identifiers, introduced myself to the patient,  provided my credentials, disclosed my location, and performed this encounter via a HIPAA-compliant, real-time, face-to-face, two-way, interactive audio and video platform and with the full consent and agreement  of the patient (or guardian as applicable.)   Patient physical location: ED, [facility]. Telehealth provider physical location: home office in state of [place] .  Video start time: 0200h EST  Video end time: 0215 EST    IDENTIFYING DATA  Ann Avery is a 29 y.o. year-old female for whom a psychiatric consultation has been ordered by the primary provider. The patient was identified using two separate identifiers.  CHIEF COMPLAINT/REASON FOR CONSULT   I'm so tired of feeling sick and hurting, I just want to end it   HISTORY OF PRESENT ILLNESS (HPI)  The patient presents with long history of mood dysregulation, dating back to her teens when she first started having suicidal ideation.  Did have an attempt as a teen (tried to drown herself).    Patient has been treated for ADD since her early middle school days, and currently is taking Vyvanse, Focalin, and Intuniv  (has been for quite a while).  However, concurrently in her teens, she began to develop marked mood swings that have been consistent through the years.  Has not received any psychiatric treatment for that, although recently her PCP has asked her to consider that she may suffer from bipolar disorder.  Patient has been struggling for past few months with increasing depression and feelings of hopelessness.  Has gone through a period of increasing GI pain, and is feeling less and less able to care for her child and to manage her ADL's.  Also, continues to lose jobs because of her interpersonal issues.  Although feels support from boyfriend (of several years) and family, she is feeling that she is becoming a burden.  Though of jumping off a second-story balcony last month, and now with intense thoughts of trying to find her boyfriend's gun to shoot herself.  Came to ED to seek safety and treatment.  No homicidal ideation.  NO psychotic sx's.  Denies drug/EtOH abuse.  BAL negative. UDS pending.   PAST PSYCHIATRIC HISTORY  See  above  Otherwise as per HPI above.  PAST MEDICAL HISTORY  Past Medical History:  Diagnosis Date   Crohn disease (HCC)    Ulcerative colitis     See ED Provider's H&{   HOME MEDICATIONS  Facility Ordered Medications  Medication   [COMPLETED] HYDROcodone -acetaminophen  (NORCO/VICODIN) 5-325 MG per tablet 1 tablet   [COMPLETED] ondansetron  (ZOFRAN -ODT) disintegrating tablet 8 mg   [COMPLETED] iohexol  (OMNIPAQUE ) 350 MG/ML injection 75 mL   [COMPLETED] oxyCODONE -acetaminophen  (PERCOCET/ROXICET) 5-325 MG per tablet 1 tablet   [COMPLETED] ketorolac  (TORADOL ) 30 MG/ML injection 15 mg   PTA Medications  Medication Sig   lisdexamfetamine (VYVANSE) 60 MG capsule Take 60 mg by mouth every morning.   guanFACINE  (INTUNIV ) 4 MG TB24 ER tablet Take 4 mg by mouth daily.   As above  ALLERGIES  Allergies[1]  SOCIAL & SUBSTANCE USE HISTORY  Social History   Socioeconomic History   Marital status: Single    Spouse name: Not on file   Number of children: Not on file   Years of education: Not on file   Highest education level: Not on file  Occupational History   Not on file  Tobacco Use   Smoking status: Never   Smokeless tobacco: Never  Vaping Use   Vaping status: Never Used  Substance and Sexual Activity   Alcohol use:  No   Drug use: No   Sexual activity: Yes  Other Topics Concern   Not on file  Social History Narrative   Not on file   Social Drivers of Health   Tobacco Use: Low Risk (02/25/2024)   Patient History    Smoking Tobacco Use: Never    Smokeless Tobacco Use: Never    Passive Exposure: Not on file  Financial Resource Strain: Not on file  Food Insecurity: Low Risk (05/20/2023)   Received from [facility]   Epic    Within the past 12 months, you worried that your food would run out before you got money to buy more: Never true    Within the past 12 months, the food you bought just didn't last and you didn't have money to get more. : Never true  Transportation Needs:  No Transportation Needs (05/20/2023)   Received from [facility]    In the past 12 months, has lack of reliable transportation kept you from medical appointments, meetings, work or from getting things needed for daily living? : No  Physical Activity: Not on file  Stress: Not on file  Social Connections: Not on file  Depression (EYV7-0): Not on file  Alcohol Screen: Not on file  Housing: Low Risk (05/20/2023)   Received from [facility]   Epic    What is your living situation today?: I have a steady place to live    Think about the place you live. Do you have problems with any of the following? Choose all that apply:: None/None on this list  Utilities: Low Risk (05/20/2023)   Received from [facility]   Utilities    In the past 12 months has the electric, gas, oil, or water company threatened to shut off services in your home? : No  Health Literacy: Not on file   Tobacco Use History[2] Social History   Substance and Sexual Activity  Alcohol Use No   Social History   Substance and Sexual Activity  Drug Use No    .  FAMILY HISTORY  Family History  Problem Relation Age of Onset   Hypertension Mother    Diabetes Father    Family Psychiatric History (if known):  Strong family history of mood disorder and substance use disorders   MENTAL STATUS EXAM (MSE)  Mental Status Exam: General Appearance: Fairly Groomed  Orientation:  Full (Time, Place, and Person)  Memory:  Immediate;   Fair Recent;   Fair Remote;   Fair  Concentration:  Concentration: Poor and Attention Span: Poor  Recall:  Fair  Attention  Poor  Eye Contact:  Minimal  Speech:  Slow  Language:  Good  Volume:  Decreased  Mood: I can't take this anymore  Affect:  Depressed and Flat  Thought Process:  Descriptions of Associations: Circumstantial  Thought Content:  Rumination  Suicidal Thoughts:  Yes.  with intent/plan  Homicidal Thoughts:  No  Judgement:  Impaired  Insight:  Shallow   Psychomotor Activity:  Psychomotor Retardation  Akathisia:  No  Fund of Knowledge:  Fair    Assets:  Communication Skills Desire for Improvement Housing Intimacy Social Support  Cognition:  WNL  ADL's:  Intact  AIMS (if indicated):       VITALS  Blood pressure 110/74, pulse 100, temperature 98.2 F (36.8 C), temperature source Oral, resp. rate (!) 22, weight 64 kg, last menstrual period 02/25/2024, SpO2 100%.  LABS  Admission on 02/25/2024  Component Date Value Ref  Range Status   Sodium 02/25/2024 138  135 - 145 mmol/L Final   Potassium 02/25/2024 3.6  3.5 - 5.1 mmol/L Final   Chloride 02/25/2024 105  98 - 111 mmol/L Final   CO2 02/25/2024 20 (L)  22 - 32 mmol/L Final   Glucose, Bld 02/25/2024 86  70 - 99 mg/dL Final   Glucose reference range applies only to samples taken after fasting for at least 8 hours.   BUN 02/25/2024 6  6 - 20 mg/dL Final   Creatinine, Ser 02/25/2024 0.94  0.44 - 1.00 mg/dL Final   Calcium 98/97/7973 9.0  8.9 - 10.3 mg/dL Final   Total Protein 98/97/7973 8.6 (H)  6.5 - 8.1 g/dL Final   Albumin 98/97/7973 4.5  3.5 - 5.0 g/dL Final   AST 98/97/7973 25  15 - 41 U/L Final   ALT 02/25/2024 8  0 - 44 U/L Final   Alkaline Phosphatase 02/25/2024 76  38 - 126 U/L Final   Total Bilirubin 02/25/2024 0.5  0.0 - 1.2 mg/dL Final   GFR, Estimated 02/25/2024 >60  >60 mL/min Final   Comment: (NOTE) Calculated using the CKD-EPI Creatinine Equation (2021)    Anion gap 02/25/2024 14  5 - 15 Final   Performed at [facility], [address] [place], [place] [address]   Alcohol, Ethyl (B) 02/25/2024 <15  <15 mg/dL Final   Comment: (NOTE) For medical purposes only. Performed at [facility], [address] [place], [place] [address]    WBC 02/25/2024 6.5  4.0 - 10.5 K/uL Final   RBC 02/25/2024 4.34  3.87 - 5.11 MIL/uL Final   Hemoglobin 02/25/2024 10.7 (L)  12.0 - 15.0 g/dL Final   HCT 98/97/7973 34.0 (L)  36.0 - 46.0 % Final   MCV 02/25/2024 78.3  (L)  80.0 - 100.0 fL Final   MCH 02/25/2024 24.7 (L)  26.0 - 34.0 pg Final   MCHC 02/25/2024 31.5  30.0 - 36.0 g/dL Final   RDW 98/97/7973 16.8 (H)  11.5 - 15.5 % Final   Platelets 02/25/2024 422 (H)  150 - 400 K/uL Final   nRBC 02/25/2024 0.0  0.0 - 0.2 % Final   Performed at [facility], [address], [place], [place] [address]   Preg, Serum 02/25/2024 NEGATIVE  NEGATIVE Final   Comment:        THE SENSITIVITY OF THIS METHODOLOGY IS >10 mIU/mL. Performed at [facility], [address], [place], [place] [address]     PSYCHIATRIC REVIEW OF SYSTEMS (ROS)  ROS: Notable for the following relevant positive findings: Review of Systems  Constitutional: Negative.   HENT: Negative.    Eyes: Negative.   Respiratory: Negative.    Cardiovascular: Negative.   Gastrointestinal: Negative.   Genitourinary: Negative.   Musculoskeletal: Negative.   Skin: Negative.   Neurological: Negative.   Endo/Heme/Allergies: Negative.   Psychiatric/Behavioral:  Positive for depression and suicidal ideas. The patient is nervous/anxious and has insomnia.     Additional findings:      Musculoskeletal: No abnormal movements observed      Gait & Station: Normal      Pain Screening: Present - severe (will consider referral for ongoing evaluation and treatment)  (Patient continues with intermittent GI pain from Crohn's)      Nutrition & Dental Concerns: Decrease in food intake and/or loss of appetite and If yes - consider referral to nutritional or dental specialist  (Because of Crohn's)  RISK FORMULATION/ASSESSMENT  Is the patient experiencing any suicidal or homicidal ideations: Yes       Explain if yes:   Patient has had suicidal ideation for at least a month, with thoughts of jumping off balcony or, more recently, of using her boyfriend's gun.  Protective factors considered for safety management:   Patient receive support from boyfriend and family, but symptoms have worsened to point  that they cannot provide reliable safety  Risk factors/concerns considered for safety management:   Prior attempt Depression Physical illness/chronic pain Recent loss Access to lethal means Hopelessness Impulsivity Isolation Barriers to accessing treatment Unmarried  Is there a safety management plan with the patient and treatment team to minimize risk factors and promote protective factors: No           Explain:   As above, sx's have reached a point at which community resources are inadequate.  Is crisis care placement or psychiatric hospitalization recommended: Yes     Based on my current evaluation and risk assessment, patient is determined at this time to be at:  High risk  *RISK ASSESSMENT Risk assessment is a dynamic process; it is possible that this patient's condition, and risk level, may change. This should be re-evaluated and managed over time as appropriate. Please re-consult psychiatric consult services if additional assistance is needed in terms of risk assessment and management. If your team decides to discharge this patient, please advise the patient how to best access emergency psychiatric services, or to call 911, if their condition worsens or they feel unsafe in any way.   Adriana JINNY Pontes, MD Telepsychiatry Consult Services    [1]  Allergies Allergen Reactions   Latex Hives, Itching and Rash   Shellfish Allergy Anaphylaxis, Hives and Rash    Other Reaction(s): Not available   Shellfish Protein-Containing Drug Products Anaphylaxis, Hives and Rash   Bee Venom Hives  [2]  Social History Tobacco Use  Smoking Status Never  Smokeless Tobacco Never

## 2024-02-26 NOTE — ED Notes (Signed)
 Called [facility] to ask about resp swab - they advised they received a bucket and will start running them soon. Advised to call back in 10-15 if not in process.

## 2024-02-26 NOTE — [facility] Group Notes (Signed)
 [facility] Group Notes:  (Nursing/MHT/Case Management/Adjunct)  Date:  02/26/2024  Time:  8:38 PM  Type of Therapy:  Psychoeducational Skills  Participation Level:  Did Not Attend  Participation Quality:  Patient did not attend  Affect:  Patient did not attend  Cognitive:  Patient did not attend   Insight:  None  Engagement in Group:  Patient did not attend   Modes of Intervention:  Education  Summary of Progress/Problems: Patient did not attend group.   Berel Najjar S 02/26/2024, 8:38 PM

## 2024-02-26 NOTE — Group Note (Signed)
 Date:  02/26/2024 Time:  6:29 PM  Group Topic/Focus:  Dimensions of Wellness:   The focus of this group is to introduce the topic of wellness and discuss the role each dimension of wellness plays in total health.  This group focused on identifying healthy boundaries.   Participation Level:  Did Not Attend  Patient was not yet admitted when group occurred.  Annalee  Edan Juday 02/26/2024, 6:29 PM

## 2024-02-26 NOTE — Progress Notes (Signed)
" °   02/26/24 2015  Psych Admission Type (Psych Patients Only)  Admission Status Voluntary  Psychosocial Assessment  Patient Complaints Depression  Eye Contact Fair  Facial Expression Flat  Affect Appropriate to circumstance;Depressed  Speech Soft  Interaction Assertive  Motor Activity Other (Comment) (pt in bed d/t flu)  Appearance/Hygiene Other (Comment) (pt in bed)  Behavior Characteristics Unable to participate  Mood Depressed;Pleasant  Thought Process  Coherency WDL  Content WDL  Delusions None reported or observed  Perception WDL  Hallucination None reported or observed  Judgment Impaired  Confusion None  Danger to Self  Current suicidal ideation? Denies    "

## 2024-02-26 NOTE — ED Notes (Signed)
 Called safe tx to take pt to [facility]

## 2024-02-26 NOTE — Progress Notes (Signed)
 Patient is a 29 year old female with history of Crohn's disease and ADHD who is admitted voluntarily to [facility] 303-1 due to having SI with a plan to use a gun in the context of  a Crohn's flare up. Patient reported that she was having AH from the pain yesterday. She was brought by [facility] to [facility]. Patient is positive for influenza A. On admission patient is calm and cooperative, A &Ox4, experiencing aches and malaise. Patient denies SI/HI/AVH currently. Skin was assessed with Kendyl MHT and found to be WNL. Consents were signed and belongings were searched per unit policy and placed in locker #9. Fall risk was assessed at low and patient verbalized understanding of fall prevention education. Patient was oriented to the unit and safety checks were initiated at 15 minute intervals.

## 2024-02-26 NOTE — BH Assessment (Signed)
 Clinician spoke to Ann Avery with [facility] to complete pt's TTS assessment. Clinician provided pt's name, MRN, location, age, room number. Secure message completed.    [facility] coordinator to update secure chat when assessment time and provider are assigned.   [name], MS, [facility], [facility] Triage Specialist [phone]

## 2024-02-26 NOTE — Progress Notes (Signed)
 [facility] LCSW Progress Note   02/26/2024    9:28 AM  Ann Avery   [id number]   Type of Contact and Topic:  Psychiatric Bed Placement   Pt accepted to [facility] 300-2    Patient meets inpatient criteria per Adriana Pontes, MD    The attending provider will be Dr. Raliegh   Call report to [phone]    Milo Sar, RN @ [facility] ED notified.     Pt scheduled  to arrive at [facility] TODAY.    Bunnie Gallop, MSW, LCSW-A  9:29 AM 02/26/2024

## 2024-02-26 NOTE — ED Provider Notes (Signed)
 Prior to transfer patient spiked a fever.  Respiratory panel is positive for influenza.  Psychiatric services have been notified.  Fever responded to antipyretic.  They are requesting Tamiflu  which has been ordered.  Plan is still for dispo per psychiatry.   Bari Roxie HERO, DO 02/26/24 1405

## 2024-02-26 NOTE — ED Notes (Signed)
 Accepting RN unable to take report at this time. Staff states RN will call back

## 2024-02-27 DIAGNOSIS — F4323 Adjustment disorder with mixed anxiety and depressed mood: Principal | ICD-10-CM

## 2024-02-27 DIAGNOSIS — F900 Attention-deficit hyperactivity disorder, predominantly inattentive type: Secondary | ICD-10-CM

## 2024-02-27 MED ORDER — MENTHOL 3 MG MT LOZG
1.0000 | LOZENGE | OROMUCOSAL | Status: DC | PRN
  Filled 2024-02-27: qty 9

## 2024-02-27 MED ORDER — ONDANSETRON HCL 4 MG PO TABS
4.0000 mg | ORAL_TABLET | Freq: Three times a day (TID) | ORAL | Status: DC | PRN
  Administered 2024-02-29: 4 mg via ORAL
  Filled 2024-02-27: qty 1

## 2024-02-27 MED ORDER — SALINE SPRAY 0.65 % NA SOLN: 1.0000 | NASAL | Status: DC | PRN

## 2024-02-27 MED ORDER — OSELTAMIVIR PHOSPHATE 75 MG PO CAPS
75.0000 mg | ORAL_CAPSULE | Freq: Two times a day (BID) | ORAL | Status: DC
  Administered 2024-02-27 – 2024-02-29 (×5): 75 mg via ORAL
  Filled 2024-02-27 (×5): qty 1

## 2024-02-27 NOTE — Plan of Care (Signed)
  Problem: Education: Goal: Emotional status will improve Outcome: Not Progressing   Problem: Activity: Goal: Interest or engagement in activities will improve Outcome: Not Progressing

## 2024-02-27 NOTE — Progress Notes (Addendum)
 Pt denies SI/HI/AVH. Pt is Flu A positive, droplet precautions maintained. Pt wants to know when she will be discharged. Pt remains pleasant, calm, and cooperative throughout shift.    02/27/24 0900  Psych Admission Type (Psych Patients Only)  Admission Status Voluntary  Psychosocial Assessment  Patient Complaints None  Eye Contact Fair  Facial Expression Flat  Affect Anxious;Sad  Speech Soft  Interaction Assertive  Motor Activity Slow  Appearance/Hygiene Unremarkable  Behavior Characteristics Appropriate to situation;Cooperative  Mood Depressed;Pleasant  Thought Process  Coherency WDL  Content WDL  Delusions None reported or observed  Perception WDL  Hallucination None reported or observed  Judgment Impaired  Confusion None  Danger to Self  Current suicidal ideation? Denies  Danger to Others  Danger to Others None reported or observed

## 2024-02-27 NOTE — BHH Counselor (Signed)
 Adult Comprehensive Assessment  Patient ID: Ann Avery, female   DOB: 1995-08-29, 29 y.o.   MRN: [id number]  Information Source: Information source: Patient  Current Stressors:  Patient states their primary concerns and needs for treatment are:: I don't have one. Patient states their goals for this hospitilization and ongoing recovery are:: Just getting back home to my 28 year old daughter Educational / Learning stressors: no Employment / Job issues: no Family Relationships: no Surveyor, Quantity / Lack of resources (include bankruptcy): sometimes Housing / Lack of housing: no Physical health (include injuries & life threatening diseases): no Social relationships: no Substance abuse: no Bereavement / Loss: no  Living/Environment/Situation:  Living Arrangements: Non-relatives/Friends Living conditions (as described by patient or guardian): Boyfriend/father of daughter Who else lives in the home?: 3 year daughter and boyfriend How long has patient lived in current situation?: 5 years What is atmosphere in current home: Comfortable, Supportive, Paramedic  Family History:  Marital status: Single Are you sexually active?: Yes What is your sexual orientation?: hetrosexual Has your sexual activity been affected by drugs, alcohol, medication, or emotional stress?: no Does patient have children?: Yes How many children?: 1 How is patient's relationship with their children?: Great  Childhood History:  By whom was/is the patient raised?: Grandparents Description of patient's relationship with caregiver when they were a child: good Patient's description of current relationship with people who raised him/her: yes she is still my bestfriend How were you disciplined when you got in trouble as a child/adolescent?: whoopings Does patient have siblings?: No Did patient suffer any verbal/emotional/physical/sexual abuse as a child?: No Did patient suffer from severe  childhood neglect?: No Has patient ever been sexually abused/assaulted/raped as an adolescent or adult?: No Was the patient ever a victim of a crime or a disaster?: No Witnessed domestic violence?: No Has patient been affected by domestic violence as an adult?: No  Education:  Highest grade of school patient has completed: some college Currently a consulting civil engineer?: No Learning disability?: No  Employment/Work Situation:   Employment Situation: Employed Where is Patient Currently Employed?: [employer], [employer] How Long has Patient Been Employed?: couple months Are You Satisfied With Your Job?: Yes Do You Work More Than One Job?: No Work Stressors: no Patient's Job has Been Impacted by Current Illness: Yes Describe how Patient's Job has Been Impacted: will need a work note What is the Longest Time Patient has Held a Job?: 1 year and half but I didn't like 3rd shift Where was the Patient Employed at that Time?: [employer] in [place] Has Patient ever Been in the [employer]?: No  Financial Resources:   Surveyor, Quantity resources: Sales executive, Medicaid, Income from employment Does patient have a lawyer or guardian?: No  Alcohol/Substance Abuse:   What has been your use of drugs/alcohol within the last 12 months?: Vape Pen If attempted suicide, did drugs/alcohol play a role in this?: No Alcohol/Substance Abuse Treatment Hx: Denies past history Is patient motivated for change?: Yes Does patient live in an environment that promotes recovery or serves as an obstacle to recovery?: Yes - promotes recovery Are others in the home using alcohol or other substances?: No Are significant others in the home willing to participate in the patient's care?: Yes Has alcohol/substance abuse ever caused legal problems?: No  Social Support System:   Patient's Community Support System: Good Describe Community Support System: Mom and stepdad Type of faith/religion: Sherlean How  does patient's faith help to cope with current illness?: no  Leisure/Recreation:   Do  You Have Hobbies?: Yes Leisure and Hobbies: spending time with my daugther  Strengths/Needs:   What is the patient's perception of their strengths?: no Patient states these barriers may affect/interfere with their treatment: no Patient states these barriers may affect their return to the community: no  Discharge Plan:   Currently receiving community mental health services: No Patient states concerns and preferences for aftercare planning are: I want a Therapist to help me learn to cope with things better Patient states they will know when they are safe and ready for discharge when: I'm ready to go Does patient have access to transportation?: Yes (Mother or step dad to transport) Does patient have financial barriers related to discharge medications?: No Will patient be returning to same living situation after discharge?: Yes  Summary/Recommendations:    Ann Avery is a 29 yo F admitted Voluntary from [facility] ED w/ h/o Crohns on [place] who presents to the ED for abdominal pain and suicidality. Abdomen has been hurting for a month and it is making her depressed and think more about suicide which worries her because she has access to boyfriend's firearms.  Pt from home with her 28 year old daughter and Boyfriend/father of daughter. Pt reported also having a supportive mother and step father. Pt works at [employer]. Pt reported using a THC infused Vape. Pt denied current thoughts of SI. Pt agreed with continue treatment at [facility] and requested a Therapist at discharge to help her cope. Also request a work note.  Patient would benefit from crisis stabilization, milieu management, medication evaluation and administration, recreation therapy, psychoeducation, group therapy, peer support, care coordination, and discharge planning.  At discharge it is recommended that the  patient adhere to the established aftercare plan.   [employer], [place]. 02/27/2024

## 2024-02-27 NOTE — Plan of Care (Signed)
   Problem: Education: Goal: Knowledge of [facility] Education information/materials will improve Outcome: Progressing Goal: Emotional status will improve Outcome: Progressing Goal: Mental status will improve Outcome: Progressing Goal: Verbalization of understanding the information provided will improve Outcome: Progressing

## 2024-02-27 NOTE — Group Note (Signed)
 Date:  02/27/2024 Time:  3:39 PM  Group Topic/Focus: Social Wellness Patients participated in a movie trivia activity designed to facilitate social interaction and cognitive engagement. The exercise promoted collective brainstorming and creative expression within a therapeutic group framework.    Participation Level:  Did Not Attend  Additional Comments:  Pt did not attend this group -- pt on isolation precautions due to flu+  Kristi HERO [facility] 02/27/2024, 3:39 PM

## 2024-02-27 NOTE — Group Note (Signed)
"                                                 [facility] LCSW Group Therapy Note    Group Date: 02/27/2024 Start Time: 0940 End Time: 1030  Type of Therapy and Topic:  Group Therapy:  Overcoming Obstacles  Participation Level:  B[facility] PARTICIPATION LEVEL: Did Not Attend  Mood:  Description of Group:   In this group patients will be encouraged to explore what they see as obstacles to their own wellness and recovery. They will be guided to discuss their thoughts, feelings, and behaviors related to these obstacles. The group will process together ways to cope with barriers, with attention given to specific choices patients can make. Each patient will be challenged to identify changes they are motivated to make in order to overcome their obstacles. This group will be process-oriented, with patients participating in exploration of their own experiences as well as giving and receiving support and challenge from other group members.  Therapeutic Goals: 1. Patient will identify personal and current obstacles as they relate to admission. 2. Patient will identify barriers that currently interfere with their wellness or overcoming obstacles.  3. Patient will identify feelings, thought process and behaviors related to these barriers. 4. Patient will identify two changes they are willing to make to overcome these obstacles:    Summary of Patient Progress   Did not attend   Therapeutic Modalities:   Cognitive Behavioral Therapy Solution Focused Therapy Motivational Interviewing Relapse Prevention Therapy   Golda Louder, LCSW "

## 2024-02-27 NOTE — Progress Notes (Signed)
(  Sleep Hours) - 7.75 (Any PRNs that were needed, meds refused, or side effects to meds)- none (Any disturbances and when (visitation, over night)- none (Concerns raised by the patient)- none (SI/HI/AVH)- denies

## 2024-02-27 NOTE — Group Note (Signed)
 Date:  02/27/2024 Time:  9:37 PM  Group Topic/Focus:  Wrap-Up Group:   The focus of this group is to help patients review their daily goal of treatment and discuss progress on daily workbooks.    Participation Level:  Did Not Attend  Participation Quality:  none  Affect:  n/a  Cognitive:  n/a  Insight: None  Engagement in Group:  None  Modes of Intervention:  Discussion  Additional Comments:   Pt was encouraged but refused to attend wrap up group  Alleyah Twombly A Braya Habermehl 02/27/2024, 9:37 PM

## 2024-02-27 NOTE — Progress Notes (Signed)
 Patient's Grandmother, Glendora Foster called and requested to speak to patient's RN. Pt gave verbal face-to-face permission to writer to speak with Jeannine Glendora Foster about her care. Grandmother expressed concern about patient being stuck in her room in isolation, reporting that patient is very tearful and feels like she is in prison. Grandmother requests that pt's MD contact her in the morning regarding DC plan at [phone]. Writer informed grandmother that message would be relayed.

## 2024-02-27 NOTE — Group Note (Signed)
 Date:  02/27/2024 Time:  9:25 AM  Group Topic/Focus: Goals group  Following an initial icebreaker and introductions, patients engaged in a structured goal-setting activity. Utilizing SMART goal worksheets, participants identified and disclosed personal objectives related to recovery maintenance and self-care strategies.    Participation Level:  Did Not Attend  Additional Comments:  Pt did not attend goals group; pt is on isolation precautions due to flu +  Kristi HERO [facility] 02/27/2024, 9:25 AM

## 2024-02-27 NOTE — Group Note (Signed)
 Date:  02/27/2024 Time:  4:44 PM  Group Topic/Focus:  Dimensions of Wellness:   The focus of this group is to introduce the topic of wellness using collage. Patients created intuitive collages: serving as a creative outlet for expressing emotions, reducing anxiety, fostering group cohesion and enabling personal insight and healing.     Participation Level:  Did Not Attend  Berwyn GORMAN Acosta 02/27/2024, 4:44 PM

## 2024-02-27 NOTE — BHH Suicide Risk Assessment (Signed)
" °  [facility] Admission Suicide Risk Assessment   Nursing information obtained from:    Demographic factors:  NA Current Mental Status:  NA Loss Factors:  Decline in physical health Historical Factors:  Impulsivity Risk Reduction Factors:  NA  Total Time spent with patient:: 1 Hour Principal Problem: Adjustment disorder with mixed anxiety and depressed mood Diagnosis:  Principal Problem:   Adjustment disorder with mixed anxiety and depressed mood Active Problems:   Attention deficit hyperactivity disorder, predominantly inattentive type   Crohn's disease of intestine (HCC)   Subjective Data: see H&P  Continued Clinical Symptoms:  Alcohol Use Disorder Identification Test Final Score (AUDIT): 0 The Alcohol Use Disorders Identification Test, Guidelines for Use in Primary Care, Second Edition.  World Science Writer [facility]). Score between 0-7:  no or low risk or alcohol related problems. Score between 8-15:  moderate risk of alcohol related problems. Score between 16-19:  high risk of alcohol related problems. Score 20 or above:  warrants further diagnostic evaluation for alcohol dependence and treatment.   CLINICAL FACTORS:   Severe Anxiety and/or Agitation More than one psychiatric diagnosis Previous Psychiatric Diagnoses and Treatments   Musculoskeletal: Strength & Muscle Tone: within normal limits Gait & Station: normal Patient leans: N/A     COGNITIVE FEATURES THAT CONTRIBUTE TO RISK:  None    SUICIDE RISK:   Mild:  Suicidal ideation of limited frequency, intensity, duration, and specificity.  There are no identifiable plans, no associated intent, mild dysphoria and related symptoms, good self-control (both objective and subjective assessment), few other risk factors, and identifiable protective factors, including available and accessible social support.  PLAN OF CARE: see H&P  I certify that inpatient services furnished can reasonably be expected to improve the  patient's condition.   Prentice Espy, MD 02/27/2024, 11:36 AM "

## 2024-02-27 NOTE — BHH Group Notes (Signed)
 Ann Avery did not attend the CSW group 02/27/24 -- pt on isolation precautions due to flu+

## 2024-02-27 NOTE — H&P (Addendum)
 Psychiatric Admission Assessment Adult  Patient Identification: Ann Avery MRN:  [id number] Date of Evaluation:  02/27/2024  Chief Complaint:  ADHD [F90.9],  Adjustment disorder with mixed anxiety and depressed mood  Principal Problem:   Adjustment disorder with mixed anxiety and depressed mood Active Problems:   Attention deficit hyperactivity disorder, predominantly inattentive type   Crohn's disease of intestine (HCC)   History of Present Illness:  Ann Avery is a 29 y.o., female with history of ADHD and Crohn's disease admitted to [facility] due to suicidal ideation secondary to severe pain due to a Crohn's flare up. She was notably found to be flu positive. UDS + for THC, opiates, and amphetamines which are reportedly for anxiety, crohn's flare up, and ADHD respectively. PDMP reviewed.   She endorsed feeling anxious and overwhelmed recently due to child having the flu, employment, being a single mother, and having a Crohn's flare up. She reports she was dealing with a significant amount of pain and in frustration said she would shoot herself with child's father's gun. Guns are locked away in the home and only child's father has access to them. She denies feeling persistently depressed or anhedonic.  However, patient does feel overwhelmed at times and will often ruminate regarding all the things she needs to take care of.  She denies ever experiencing any prior history of suicidal ideation and has never attempted suicide in the past.  She reports poor sleep at times although it is unclear whether this is related to anxiety or her ADHD medications.  She does occasionally feel hopeless due to the chronicity of her Crohn's disease.   Patient endorses history of ADHD since first grade and has been on multiple medications since then.  She reports she is currently on Intuniv , Focalin, and Vyvanse.  She states that she takes them as prescribed every morning.  She is unclear whether this may be  contributing to her insomnia.  She denies history of panic attacks.  She denies history of mania or psychosis.  She does endorse patient having decreased need for sleep but it is not persistent.  She denies history of pressured speech, grandiosity, flight of ideas.  She denies history of psychosis.  She did endorse feeling that she was in significant amount of pain to the point that she was seeing curved lines that were not there but denied any other symptoms of psychosis.  She reports overall her her Crohn's disease has been fairly well-controlled although does have significant amounts of pain when she has flareups which pain medication do not fully address at times.  She denies any current abdominal pain, constipation, or diarrhea.  Patient denies any illicit use of prescription drugs. She does endorse taking opiates when her flare-ups occur, and stimulants as prescribed by her PCP. She did admit to using THC vape to help with her feeling anxious at times to moderate success. She is unable to state how often she uses it but states she will use it a few times per week.     Collateral information obtained (Mother Emmie Larve @ [phone]) Patient was overwhelmed due to baby was sick and patient was exhausted with her stress.  Patient has never endorsed suicidal ideation in the past.  Patient has not shown any signs that she would be a safety risk to self or others.  Patient has had a history of radically appearing depressed and often will be anxious especially recently due to feeling overwhelmed.  Parents had overall been supporting patient financially but  had recently decreased this to encourage her to be employed and pay for her own rent.  Patient does have an extended history of ADHD and did require an IEP to graduate high school. She had a full ride to [employer] but chose to drop out.    Past Psychiatric History:  Previous psych diagnoses: ADHD Prior inpatient psychiatric treatment: denies Prior  outpatient psychiatric treatment: denies Current psychiatric provider: none Current therapist:none  History of suicide attempts: none History of homicide: none  Past Psychotropics: vyvanse, focalin, intuniv   Substance Use History: Alcohol: never drinks Tobacco: denies Illicit Substance: denies Cannabis: THC vape    Is the patient at risk to self? Yes Has the patient been a risk to self in the past 6 months? No Has the patient been a risk to self within the distant past? No Is the patient a risk to others? No Has the patient been a risk to others in the past 6 months? No Has the patient been a risk to others within the distant past? No  Alcohol Screening: 1. How often do you have a drink containing alcohol?: Never 2. How many drinks containing alcohol do you have on a typical day when you are drinking?: 1 or 2 3. How often do you have six or more drinks on one occasion?: Never AUDIT-C Score: 0 4. How often during the last year have you found that you were not able to stop drinking once you had started?: Never 5. How often during the last year have you failed to do what was normally expected from you because of drinking?: Never 6. How often during the last year have you needed a first drink in the morning to get yourself going after a heavy drinking session?: Never 7. How often during the last year have you had a feeling of guilt of remorse after drinking?: Never 8. How often during the last year have you been unable to remember what happened the night before because you had been drinking?: Never 9. Have you or someone else been injured as a result of your drinking?: No 10. Has a relative or friend or a doctor or another health worker been concerned about your drinking or suggested you cut down?: No Alcohol Use Disorder Identification Test Final Score (AUDIT): 0 Tobacco Screening:    Substance Abuse History in the last 12 months: No  Allergies:  Allergies[1]  Past  Medical/Surgical History:  Medical Diagnoses: Crohn's Disease Home Rx: unknown Prior Hosp: endorses for Crohn's Prior Surgeries / non-head trauma: denies  Head trauma: denies LOC: denies Seizures: denies  Family History:  Family History  Problem Relation Age of Onset   Hypertension Mother    Diabetes Father     Social History:  Abuse: denies Marital Status: single Children: 1 Employment: employed at [employer]: lives with child and child's father Finances: employed [employer]: denies Weapons: weapons in house but child's father has them secured and patient does not have access  Lab Results:  Results for orders placed or performed during the hospital encounter of 02/25/24 (from the past 48 hours)  Comprehensive metabolic panel     Status: Abnormal   Collection Time: 02/25/24  7:22 PM  Result Value Ref Range   Sodium 138 135 - 145 mmol/L   Potassium 3.6 3.5 - 5.1 mmol/L   Chloride 105 98 - 111 mmol/L   CO2 20 (L) 22 - 32 mmol/L   Glucose, Bld 86 70 - 99 mg/dL    Comment: Glucose reference range  applies only to samples taken after fasting for at least 8 hours.   BUN 6 6 - 20 mg/dL   Creatinine, Ser 9.05 0.44 - 1.00 mg/dL   Calcium 9.0 8.9 - 89.6 mg/dL   Total Protein 8.6 (H) 6.5 - 8.1 g/dL   Albumin 4.5 3.5 - 5.0 g/dL   AST 25 15 - 41 U/L   ALT 8 0 - 44 U/L   Alkaline Phosphatase 76 38 - 126 U/L   Total Bilirubin 0.5 0.0 - 1.2 mg/dL   GFR, Estimated >39 >39 mL/min    Comment: (NOTE) Calculated using the CKD-EPI Creatinine Equation (2021)    Anion gap 14 5 - 15    Comment: Performed at [facility], [address], [place], [place] [address]  Ethanol     Status: None   Collection Time: 02/25/24  7:22 PM  Result Value Ref Range   Alcohol, Ethyl (B) <15 <15 mg/dL    Comment: (NOTE) For medical purposes only. Performed at [facility], [address], [place], [place] [address]   cbc     Status: Abnormal   Collection Time: 02/25/24  7:22 PM  Result  Value Ref Range   WBC 6.5 4.0 - 10.5 K/uL   RBC 4.34 3.87 - 5.11 MIL/uL   Hemoglobin 10.7 (L) 12.0 - 15.0 g/dL   HCT 65.9 (L) 63.9 - 53.9 %   MCV 78.3 (L) 80.0 - 100.0 fL   MCH 24.7 (L) 26.0 - 34.0 pg   MCHC 31.5 30.0 - 36.0 g/dL   RDW 83.1 (H) 88.4 - 84.4 %   Platelets 422 (H) 150 - 400 K/uL   nRBC 0.0 0.0 - 0.2 %    Comment: Performed at [facility], [address] [place], [place] [address]  Rapid urine drug screen (hospital performed)     Status: Abnormal   Collection Time: 02/25/24  7:22 PM  Result Value Ref Range   Opiates POSITIVE (A) NEGATIVE   Cocaine NEGATIVE NEGATIVE   Benzodiazepines NEGATIVE NEGATIVE   Amphetamines POSITIVE (A) NEGATIVE   Tetrahydrocannabinol POSITIVE (A) NEGATIVE   Barbiturates NEGATIVE NEGATIVE   Methadone Scn, Ur NEGATIVE NEGATIVE   Fentanyl  NEGATIVE NEGATIVE    Comment: (NOTE) Drug screen is for Medical Purposes only. Positive results are preliminary only. If confirmation is needed, notify lab within 5 days.  Drug Class                 Cutoff (ng/mL) Amphetamine and metabolites 1000 Barbiturate and metabolites 200 Benzodiazepine              200 Opiates and metabolites     300 Cocaine and metabolites     300 THC                         50 Fentanyl                     5 Methadone                   300  Trazodone  is metabolized in vivo to several metabolites,  including pharmacologically active m-CPP, which is excreted in the  urine.  Immunoassay screens for amphetamines and MDMA have potential  cross-reactivity with these compounds and may provide false positive  result.  Performed at [facility], [address], [place], [place] [address]   hCG, serum, qualitative  Status: None   Collection Time: 02/25/24  7:22 PM  Result Value Ref Range   Preg, Serum NEGATIVE NEGATIVE    Comment:        THE SENSITIVITY OF THIS METHODOLOGY IS >10 mIU/mL. Performed at [facility], [address] [place], [place]  [address]   Resp panel by RT-PCR (RSV, Flu A&B, Covid) Anterior Nasal Swab     Status: Abnormal   Collection Time: 02/26/24 11:44 AM   Specimen: Anterior Nasal Swab  Result Value Ref Range   SARS Coronavirus 2 by RT PCR NEGATIVE NEGATIVE   Influenza A by PCR POSITIVE (A) NEGATIVE   Influenza B by PCR NEGATIVE NEGATIVE    Comment: (NOTE) The Xpert Xpress SARS-CoV-2/FLU/RSV plus assay is intended as an aid in the diagnosis of influenza from Nasopharyngeal swab specimens and should not be used as a sole basis for treatment. Nasal washings and aspirates are unacceptable for Xpert Xpress SARS-CoV-2/FLU/RSV testing.  Fact Sheet for Patients: bloggercourse.com  Fact Sheet for Healthcare Providers: seriousbroker.it  This test is not yet approved or cleared by the [place]  FDA and has been authorized for detection and/or diagnosis of SARS-CoV-2 by FDA under an Emergency Use Authorization (EUA). This EUA will remain in effect (meaning this test can be used) for the duration of the COVID-19 declaration under Section 564(b)(1) of the Act, 21 U.S.C. section 360bbb-3(b)(1), unless the authorization is terminated or revoked.     Resp Syncytial Virus by PCR NEGATIVE NEGATIVE    Comment: (NOTE) Fact Sheet for Patients: bloggercourse.com  Fact Sheet for Healthcare Providers: seriousbroker.it  This test is not yet approved or cleared by the [place]  FDA and has been authorized for detection and/or diagnosis of SARS-CoV-2 by FDA under an Emergency Use Authorization (EUA). This EUA will remain in effect (meaning this test can be used) for the duration of the COVID-19 declaration under Section 564(b)(1) of the Act, 21 U.S.C. section 360bbb-3(b)(1), unless the authorization is terminated or revoked.  Performed at [facility], [address], [place], [place] [address]     Blood  Alcohol level:  Lab Results  Component Value Date   [facility] <15 02/25/2024   ETH <10 11/25/2021    Metabolic Disorder Labs:  No results found for: HGBA1C, MPG No results found for: PROLACTIN No results found for: CHOL, TRIG, HDL, CHOLHDL, VLDL, LDLCALC  Current Medications: Current Facility-Administered Medications  Medication Dose Route Frequency Provider Last Rate Last Admin   acetaminophen  (TYLENOL ) tablet 650 mg  650 mg Oral Q6H PRN Ajibola, Ene A, NP   650 mg at 02/27/24 0702   alum & mag hydroxide-simeth (MAALOX/MYLANTA) 200-200-20 MG/5ML suspension 30 mL  30 mL Oral Q4H PRN Ajibola, Ene A, NP       haloperidol  (HALDOL ) tablet 5 mg  5 mg Oral TID PRN Ajibola, Ene A, NP       And   diphenhydrAMINE  (BENADRYL ) capsule 50 mg  50 mg Oral TID PRN Ajibola, Ene A, NP       haloperidol  lactate (HALDOL ) injection 5 mg  5 mg Intramuscular TID PRN Ajibola, Ene A, NP       And   diphenhydrAMINE  (BENADRYL ) injection 50 mg  50 mg Intramuscular TID PRN Ajibola, Ene A, NP       And   LORazepam  (ATIVAN ) injection 2 mg  2 mg Intramuscular TID PRN Ajibola, Ene A, NP       haloperidol  lactate (HALDOL ) injection 10 mg  10  mg Intramuscular TID PRN Ajibola, Ene A, NP       And   diphenhydrAMINE  (BENADRYL ) injection 50 mg  50 mg Intramuscular TID PRN Ajibola, Ene A, NP       And   LORazepam  (ATIVAN ) injection 2 mg  2 mg Intramuscular TID PRN Ajibola, Ene A, NP       hydrOXYzine  (ATARAX ) tablet 25 mg  25 mg Oral TID PRN Ajibola, Ene A, NP       magnesium  hydroxide (MILK OF MAGNESIA) suspension 30 mL  30 mL Oral Daily PRN Ajibola, Ene A, NP       menthol  (CEPACOL) lozenge 3 mg  1 lozenge Oral PRN Lynnette Barter, MD       ondansetron  (ZOFRAN ) tablet 4 mg  4 mg Oral Q8H PRN Lynnette Barter, MD       oseltamivir  (TAMIFLU ) capsule 75 mg  75 mg Oral Q12H Itzamar Traynor, MD   75 mg at 02/27/24 0801   sodium chloride  (OCEAN) 0.65 % nasal spray 1 spray  1 spray Each Nare PRN Lynnette Barter, MD       traZODone   (DESYREL ) tablet 50 mg  50 mg Oral QHS PRN Ajibola, Ene A, NP        PTA Medications: Medications Prior to Admission  Medication Sig Dispense Refill Last Dose/Taking   betamethasone dipropionate (DIPROLENE) 0.05 % ointment Apply 1 application  topically 2 (two) times daily as needed (exzema x 2 wk).   Taking As Needed   dexmethylphenidate (FOCALIN) 2.5 MG tablet Take 2.5 mg by mouth daily.      guanFACINE  (INTUNIV ) 4 MG TB24 ER tablet Take 4 mg by mouth daily.      lisdexamfetamine (VYVANSE) 60 MG capsule Take 60 mg by mouth every morning.      mesalamine (PENTASA) 500 MG CR capsule Take 500 mg by mouth in the morning and at bedtime.      oseltamivir  (TAMIFLU ) 75 MG capsule Take 1 capsule (75 mg total) by mouth every 12 (twelve) hours. 10 capsule 0     Physical Findings: AIMS: No  CIWA:    COWS:     Psychiatric Specialty Exam: General Appearance: Appropriate for Environment; Casual   Eye Contact: Fair   Speech: Clear and Coherent; Normal Rate   Volume: Normal   Mood: Anxious   Affect: Appropriate; Congruent   Thought Content: Logical   Suicidal Thoughts: Suicidal Thoughts: No   Homicidal Thoughts: Homicidal Thoughts: No   Thought Process: Coherent; Goal Directed; Linear   Orientation: Full (Time, Place and Person)     Memory: Immediate Good; Recent Good; Remote Good   Judgment: Fair   Insight: Fair   Concentration: Good   Recall: Good   Fund of Knowledge: Good   Language: Good   Psychomotor Activity: Psychomotor Activity: Normal   Assets: Communication Skills; Desire for Improvement; Physical Health   Sleep: Sleep: Good    Review of Systems Review of Systems  Respiratory:  Negative for shortness of breath.   Cardiovascular:  Negative for chest pain.  Gastrointestinal:  Negative for abdominal pain, constipation, diarrhea, heartburn, nausea and vomiting.  Neurological:  Negative for headaches.    Vital signs: Blood pressure 118/80, pulse (!) 124,  temperature 98.5 F (36.9 C), temperature source Oral, resp. rate 19, height 5' 7 (1.702 m), weight 64.1 kg, last menstrual period 02/25/2024, SpO2 99%. Body mass index is 22.15 kg/m. Physical Exam Constitutional:      General: She is not in acute distress.  Appearance: Normal appearance. She is not ill-appearing or diaphoretic.  HENT:     Head: Normocephalic and atraumatic.     Nose: Nose normal.  Eyes:     Extraocular Movements: Extraocular movements intact.  Cardiovascular:     Rate and Rhythm: Normal rate.     Pulses: Normal pulses.  Pulmonary:     Effort: Pulmonary effort is normal.  Musculoskeletal:        General: Normal range of motion.     Cervical back: Normal range of motion.  Skin:    General: Skin is warm and dry.  Neurological:     General: No focal deficit present.     Mental Status: She is alert and oriented to person, place, and time. Mental status is at baseline.     Assets  Assets:Communication Skills; Desire for Improvement; Physical Health   Treatment Plan Summary: Daily contact with patient to assess and evaluate symptoms and progress in treatment and medication management  ASSESSMENT: Patient's symptoms are most consistent with adjustment disorder with depressed mood and anxiety.  She does not meet criteria for major depressive disorder.  Suicidal ideation appears to be the combination of multiple psychosocial stressors as well as Crohn's flareup.  Patient is also positive for the influenza at this time so Tamiflu  was initiated.  As needed medications will be started to aid with anxiety and sleep.  ADHD medications are being held and will advise patient to hold Focalin as to reduce medication burden and also to aid with current feelings of anxiety.  Patient would strongly benefit and is amenable to getting a therapist to aid with coping skills.   PLAN: Safety and Monitoring:  -- Voluntary admission to inpatient psychiatric unit for safety,  stabilization and treatment  -- Daily contact with patient to assess and evaluate symptoms and progress in treatment  -- Patient's case to be discussed in multi-disciplinary team meeting  -- Observation Level : q15 minute checks  -- Vital signs: q12 hours  -- Precautions: suicide, elopement, and assault  2. Psychiatric Problems Adjustment disorder with depressed mood and anxiety ADHD predominantly inattentive type -Advise outpatient psychotherapy - Holding home medications Vyvanse, Intuniv , and Focalin - Hydroxyzine  25 mg 3 times daily as needed for anxiety - Trazodone  50 mg nightly as needed for insomnia  -PRNs: maalox, milk of magnesia, hydroxyzine , trazodone  -- As needed agitation protocol in-place  The risks/benefits/side-effects/alternatives to the above medication were discussed in detail with the patient and time was given for questions. The patient consents to medication trial. FDA black box warnings, if present, were discussed.  The patient is agreeable with the medication plan, as above. We will monitor the patient's response to pharmacologic treatment, and adjust medications as necessary.  3. Medical Problems Crohn's disease - Continue to monitor  Influenza A - Tamiflu  75 mg twice daily for 5 days  4. Routine and other pertinent labs: TSH, vitamin D ordered  5. Group Therapy:  -- Encouraged patient to participate in unit milieu and in scheduled group therapies   -- Short Term Goals: Ability to identify changes in lifestyle to reduce recurrence of condition, verbalize feelings, identify and develop effective coping behaviors, maintain clinical measurements within normal limits, and identify triggers associated with substance abuse/mental health issues will improve. Improvement in ability to demonstrate self-control and comply with prescribed medications.  -- Long Term Goals: Improvement in symptoms so as ready for discharge -- Patient is encouraged to participate in  group therapy while admitted to the psychiatric unit. --  We will address other chronic and acute stressors, which contributed to the patient's Adjustment disorder with mixed anxiety and depressed mood in order to reduce the risk of self-harm at discharge.  6. Discharge Planning:   -- Social work and case management to assist with discharge planning and identification of hospital follow-up needs prior to discharge  -- Estimated LOS: 5-7 days  -- Discharge Concerns: Need to establish a safety plan; Medication compliance and effectiveness  -- Discharge Goals: Return home with outpatient referrals for mental health follow-up including medication management/psychotherapy  I certify that inpatient services furnished can reasonably be expected to improve the patient's condition.    Signed: Prentice Espy, MD 02/27/2024, 11:37 AM     [1]  Allergies Allergen Reactions   Latex Hives, Itching and Rash   Shellfish Allergy Anaphylaxis, Hives and Rash    Other Reaction(s): Not available   Shellfish Protein-Containing Drug Products Anaphylaxis, Hives and Rash   Bee Venom Hives

## 2024-02-28 ENCOUNTER — Encounter (HOSPITAL_COMMUNITY): Payer: Self-pay

## 2024-02-28 NOTE — Progress Notes (Signed)
 Tour of Duty:  Prentice JINNY Angle, RN, 02/28/2024, Tour of Duty: 0700-1900  SI/HI/AVH: Denies  Self-Reported   Mood: Positive  Anxiety: Endorses Depression: Denies Irritability: Denies  Broset  Violence Prevention Guidelines *See Row Information*: Small Violence Risk interventions implemented   LBM  Last BM Date : 02/27/24   Pain: not present  Patient Refusals (including Rx): No  >>Shift Summary: Patient observed to be mildly anxious but pleasant in her room. Patient able to make needs known. Patient observed to engage appropriately with staff, adherent with masking policy. Patient taking medications as prescribed. This shift, no PRN medication requested or required. No reported or observed side effects to medication. No reported or observed agitation, aggression, or other acute emotional distress. No reported or observed physical abnormalities or concerns.  Last Vitals  Vitals Weight: 64.1 kg Temp: 98.5 F (36.9 C) Temp Source: Oral Pulse Rate: (!) 119 Resp: 16 BP: 119/77 Patient Position: (not recorded)  Admission Type  Psych Admission Type (Psych Patients Only) Admission Status: Voluntary Date 72 hour document signed : (not recorded) Time 72 hour document signed : (not recorded) Provider Notified (First and Last Name) (see details for LINK to note): (not recorded)   Psychosocial Assessment  Psychosocial Assessment Patient Complaints: None Eye Contact: Fair Facial Expression: Anxious Affect: Anxious Speech: Soft Interaction: Assertive Motor Activity: Other (Comment) (WDL) Appearance/Hygiene: Unremarkable Behavior Characteristics: Unable to participate Mood: Anxious, Pleasant   Aggressive Behavior  Targets: (not recorded)   Thought Process  Thought Process Coherency: Within Defined Limits Content: Within Defined Limits Delusions: None reported or observed Perception: Within Defined Limits Hallucination: None reported or observed Judgment:  Impaired Confusion: None  Danger to Self/Others  Danger to Self Current suicidal ideation?: Denies Description of Suicide Plan: (not recorded) Self-Injurious Behavior: (not recorded) Agreement Not to Harm Self: (not recorded) Description of Agreement: (not recorded) Danger to Others: None reported or observed

## 2024-02-28 NOTE — [facility] Group Notes (Signed)
 Adult Psychoeducational Group Note  Date:  02/28/2024 Time:  9:15 PM  Group Topic/Focus:  Wrap-Up Group:   The focus of this group is to help patients review their daily goal of treatment and discuss progress on daily workbooks.  Participation Level:  Did Not Attend  Ann Avery 02/28/2024, 9:15 PM

## 2024-02-28 NOTE — Progress Notes (Signed)
 Pt did not attend grief and loss group

## 2024-02-28 NOTE — [facility] Suicide Risk Assessment (Signed)
 [facility] INPATIENT:  Family/Significant Other Suicide Prevention Education  Suicide Prevention Education:  Contact Attempts: Emmie Larve mother ph: [phone], (name of family member/significant other) has been identified by the patient as the family member/significant other with whom the patient will be residing, and identified as the person(s) who will aid the patient in the event of a mental health crisis.  With written consent from the patient, two attempts were made to provide suicide prevention education, prior to and/or following the patient's discharge.  We were unsuccessful in providing suicide prevention education.  A suicide education pamphlet was given to the patient to share with family/significant other.  Date and time of first attempt:02/28/2024/1013   Everlene Cunning 02/28/2024, 10:13 AM

## 2024-02-28 NOTE — Progress Notes (Signed)
(  Sleep Hours) -7.75 (Any PRNs that were needed, meds refused, or side effects to meds)- hydroxyzine  (Any disturbances and when (visitation, over night)-none (Concerns raised by the patient)-  (SI/HI/AVH)-denies all

## 2024-02-28 NOTE — [facility] Suicide Risk Assessment (Signed)
 [facility] INPATIENT:  Family/Significant Other Suicide Prevention Education  Suicide Prevention Education:  Education Completed; Emmie Larve (mother) [phone],  (name of family member/significant other) has been identified by the patient as the family member/significant other with whom the patient will be residing, and identified as the person(s) who will aid the patient in the event of a mental health crisis (suicidal ideations/suicide attempt).  With written consent from the patient, the family member/significant other has been provided the following suicide prevention education, prior to the and/or following the discharge of the patient.  The suicide prevention education provided includes the following: Suicide risk factors Suicide prevention and interventions National Suicide Hotline telephone number [facility] assessment telephone number [facility] Emergency Assistance 911 [facility] and/or Residential Mobile Crisis Unit telephone number  Request made of family/significant other to: Remove weapons (e.g., guns, rifles, knives), all items previously/currently identified as safety concern.   Remove drugs/medications (over-the-counter, prescriptions, illicit drugs), all items previously/currently identified as a safety concern.  The family member/significant other verbalizes understanding of the suicide prevention education information provided.  The family member/significant other agrees to remove the items of safety concern listed above.  Louetta Lame 02/28/2024, 3:22 PM

## 2024-02-28 NOTE — Progress Notes (Signed)
" °   02/27/24 2045  Psych Admission Type (Psych Patients Only)  Admission Status Voluntary  Psychosocial Assessment  Patient Complaints Other (Comment) (pt c/o chills an extra blanket was given)  Eye Contact Fair  Facial Expression Flat  Affect Appropriate to circumstance;Depressed  Speech Soft  Interaction Assertive  Motor Activity Slow (pt in bed d/t flu)  Appearance/Hygiene Other (Comment) (pt in bed)  Behavior Characteristics Unable to participate  Mood Pleasant;Sad  Thought Process  Coherency WDL  Content WDL  Delusions None reported or observed  Perception WDL  Hallucination None reported or observed  Judgment Impaired  Confusion None  Danger to Self  Current suicidal ideation? Denies    "

## 2024-02-28 NOTE — Progress Notes (Signed)
(  Sleep Hours) -7  (Any PRNs that were needed, meds refused, or side effects to meds)-hydroxyzine  25mg ; Trazodone  50mg   (Any disturbances and when (visitation, over night)-none  (Concerns raised by the patient)- none  (SI/HI/AVH)-denies

## 2024-02-28 NOTE — Group Note (Signed)
 Recreation Therapy Group Note   Group Topic:Communication  Group Date: 02/28/2024 Start Time: 0945 End Time: 1010 Facilitators: Sarp Vernier-McCall, LRT,CTRS Location: [address] Dayroom   Group Topic: Communication, Team Building, Problem Solving  Goal Area(s) Addresses:  Patient will effectively work with peer towards shared goal.  Patient will identify skills used to make activity successful.  Patient will identify how skills used during activity can be applied to reach post d/c goals.   Behavioral Response:   Intervention: STEM Activity- Glass Blower/designer  Activity: Tallest [employer]. In teams of 5-6, patients were given 11 craft pipe cleaners. Using the materials provided, patients were instructed to compete again the opposing team(s) to build the tallest free-standing structure from floor level. The activity was timed; difficulty increased by clinical research associate as production designer, theatre/television/film continued.  Systematically resources were removed with additional directions for example, placing one arm behind their back, working in silence, and shape stipulations. LRT facilitated post-activity discussion reviewing team processes and necessary communication skills involved in completion. Patients were encouraged to reflect how the skills utilized, or not utilized, in this activity can be incorporated to positively impact support systems post discharge.  Education: Pharmacist, Community, Scientist, Physiological, Discharge Planning   Education Outcome: Acknowledges education/In group clarification offered/Needs additional education.    Affect/Mood: N/A   Participation Level: Did not attend    Clinical Observations/Individualized Feedback:      Plan: Continue to engage patient in RT group sessions 2-3x/week.   Ann Avery, LRT,CTRS 02/28/2024 12:47 PM

## 2024-02-28 NOTE — Plan of Care (Signed)
  Problem: Activity: Goal: Sleeping patterns will improve Outcome: Progressing   

## 2024-02-28 NOTE — Progress Notes (Signed)
 Patient reports that she feels better now. On call provider gave no additional orders.

## 2024-02-28 NOTE — Plan of Care (Signed)
   Problem: Education: Goal: Emotional status will improve Outcome: Progressing Goal: Mental status will improve Outcome: Progressing

## 2024-02-28 NOTE — Progress Notes (Signed)
 Patient used call bell and reported her left side felt numb. VS were taken and within normal range except her pulse was slightly elevated. Patient reports that this sometimes happens to her with crohn's disease. Gatorade given and clinical research associate and patient requested that her heat be turned down.Will notify provider on call about pulse,

## 2024-02-28 NOTE — BH IP Treatment Plan (Signed)
 Interdisciplinary Treatment and Diagnostic Plan Update  02/28/2024 Time of Session: 10:00 AM  Ann Avery MRN: [id number]  Principal Diagnosis: Adjustment disorder with mixed anxiety and depressed mood  Secondary Diagnoses: Principal Problem:   Adjustment disorder with mixed anxiety and depressed mood Active Problems:   Attention deficit hyperactivity disorder, predominantly inattentive type   Crohn's disease of intestine (HCC)   Current Medications:  Current Facility-Administered Medications  Medication Dose Route Frequency Provider Last Rate Last Admin   acetaminophen  (TYLENOL ) tablet 650 mg  650 mg Oral Q6H PRN Ajibola, Ene A, NP   650 mg at 02/27/24 0702   alum & mag hydroxide-simeth (MAALOX/MYLANTA) 200-200-20 MG/5ML suspension 30 mL  30 mL Oral Q4H PRN Ajibola, Ene A, NP       haloperidol  (HALDOL ) tablet 5 mg  5 mg Oral TID PRN Ajibola, Ene A, NP       And   diphenhydrAMINE  (BENADRYL ) capsule 50 mg  50 mg Oral TID PRN Ajibola, Ene A, NP       haloperidol  lactate (HALDOL ) injection 5 mg  5 mg Intramuscular TID PRN Ajibola, Ene A, NP       And   diphenhydrAMINE  (BENADRYL ) injection 50 mg  50 mg Intramuscular TID PRN Ajibola, Ene A, NP       And   LORazepam  (ATIVAN ) injection 2 mg  2 mg Intramuscular TID PRN Ajibola, Ene A, NP       haloperidol  lactate (HALDOL ) injection 10 mg  10 mg Intramuscular TID PRN Ajibola, Ene A, NP       And   diphenhydrAMINE  (BENADRYL ) injection 50 mg  50 mg Intramuscular TID PRN Ajibola, Ene A, NP       And   LORazepam  (ATIVAN ) injection 2 mg  2 mg Intramuscular TID PRN Ajibola, Ene A, NP       hydrOXYzine  (ATARAX ) tablet 25 mg  25 mg Oral TID PRN Ajibola, Ene A, NP   25 mg at 02/27/24 2047   magnesium  hydroxide (MILK OF MAGNESIA) suspension 30 mL  30 mL Oral Daily PRN Ajibola, Ene A, NP       menthol  (CEPACOL) lozenge 3 mg  1 lozenge Oral PRN Lynnette Barter, MD       ondansetron  (ZOFRAN ) tablet 4 mg  4 mg Oral Q8H PRN Lynnette Barter, MD       oseltamivir   (TAMIFLU ) capsule 75 mg  75 mg Oral Q12H Lynnette Barter, MD   75 mg at 02/28/24 1007   sodium chloride  (OCEAN) 0.65 % nasal spray 1 spray  1 spray Each Nare PRN Lynnette Barter, MD       traZODone  (DESYREL ) tablet 50 mg  50 mg Oral QHS PRN Ajibola, Ene A, NP       PTA Medications: Medications Prior to Admission  Medication Sig Dispense Refill Last Dose/Taking   betamethasone dipropionate (DIPROLENE) 0.05 % ointment Apply 1 application  topically 2 (two) times daily as needed (exzema x 2 wk).   Taking As Needed   dexmethylphenidate (FOCALIN) 2.5 MG tablet Take 2.5 mg by mouth daily.      guanFACINE  (INTUNIV ) 4 MG TB24 ER tablet Take 4 mg by mouth daily.      lisdexamfetamine (VYVANSE) 60 MG capsule Take 60 mg by mouth every morning.      mesalamine (PENTASA) 500 MG CR capsule Take 500 mg by mouth in the morning and at bedtime.      oseltamivir  (TAMIFLU ) 75 MG capsule Take 1 capsule (75 mg total) by  mouth every 12 (twelve) hours. 10 capsule 0     Patient Stressors:    Patient Strengths:    Treatment Modalities: Medication Management, Group therapy, Case management,  1 to 1 session with clinician, Psychoeducation, Recreational therapy.   Physician Treatment Plan for Primary Diagnosis: Adjustment disorder with mixed anxiety and depressed mood Long Term Goal(s):     Short Term Goals:    Medication Management: Evaluate patient's response, side effects, and tolerance of medication regimen.  Therapeutic Interventions: 1 to 1 sessions, Unit Group sessions and Medication administration.  Evaluation of Outcomes: Adequate for Discharge  Physician Treatment Plan for Secondary Diagnosis: Principal Problem:   Adjustment disorder with mixed anxiety and depressed mood Active Problems:   Attention deficit hyperactivity disorder, predominantly inattentive type   Crohn's disease of intestine (HCC)  Long Term Goal(s):     Short Term Goals:       Medication Management: Evaluate patient's response, side  effects, and tolerance of medication regimen.  Therapeutic Interventions: 1 to 1 sessions, Unit Group sessions and Medication administration.  Evaluation of Outcomes: Adequate for Discharge   RN Treatment Plan for Primary Diagnosis: Adjustment disorder with mixed anxiety and depressed mood Long Term Goal(s): Knowledge of disease and therapeutic regimen to maintain health will improve  Short Term Goals: Ability to remain free from injury will improve, Ability to verbalize frustration and anger appropriately will improve, Ability to demonstrate self-control, Ability to participate in decision making will improve, Ability to verbalize feelings will improve, Ability to disclose and discuss suicidal ideas, Ability to identify and develop effective coping behaviors will improve, and Compliance with prescribed medications will improve  Medication Management: RN will administer medications as ordered by provider, will assess and evaluate patient's response and provide education to patient for prescribed medication. RN will report any adverse and/or side effects to prescribing provider.  Therapeutic Interventions: 1 on 1 counseling sessions, Psychoeducation, Medication administration, Evaluate responses to treatment, Monitor vital signs and CBGs as ordered, Perform/monitor CIWA, COWS, AIMS and Fall Risk screenings as ordered, Perform wound care treatments as ordered.  Evaluation of Outcomes: Adequate for Discharge   LCSW Treatment Plan for Primary Diagnosis: Adjustment disorder with mixed anxiety and depressed mood Long Term Goal(s): Safe transition to appropriate next level of care at discharge, Engage patient in therapeutic group addressing interpersonal concerns.  Short Term Goals: Engage patient in aftercare planning with referrals and resources, Increase social support, Increase ability to appropriately verbalize feelings, Increase emotional regulation, Facilitate acceptance of mental health  diagnosis and concerns, Facilitate patient progression through stages of change regarding substance use diagnoses and concerns, Identify triggers associated with mental health/substance abuse issues, and Increase skills for wellness and recovery  Therapeutic Interventions: Assess for all discharge needs, 1 to 1 time with Social worker, Explore available resources and support systems, Assess for adequacy in community support network, Educate family and significant other(s) on suicide prevention, Complete Psychosocial Assessment, Interpersonal group therapy.  Evaluation of Outcomes: Adequate for Discharge   Progress in Treatment: Attending groups: No. Patient is on isolation precautions due to being flu positive.  Participating in groups: No. Taking medication as prescribed: Yes. Toleration medication: Yes. Family/Significant other contact made: No, will contact:  Emmie Larve mother ph: [phone]. Patient understands diagnosis: Yes. Discussing patient identified problems/goals with staff: Yes. Medical problems stabilized or resolved: Yes. Denies suicidal/homicidal ideation: Yes. Issues/concerns per patient self-inventory: No. None identified.  New problem(s) identified: No, Describe:  None reported.  New Short Term/Long Term Goal(s): medication stabilization, elimination  of SI thoughts, development of comprehensive mental wellness plan.    Patient Goals: Just getting out of here and therapy  Discharge Plan or Barriers: Patient recently admitted. CSW will continue to follow and assess for appropriate referrals and possible discharge planning.    Reason for Continuation of Hospitalization: Anxiety  Estimated Length of Stay: 1-2 days  Last 3 [place] Suicide Severity Risk Score: Flowsheet Row Ad[facility] (Current) [facility] from [facility]/25/2024 in [facility] Emergency Department at [facility] ED from 03/12/2023 in [facility]  Emergency Department at [facility]  C-SSRS RISK CATEGORY High Risk High Risk No Risk    Last PHQ 2/9 Scores:    04/21/2016    4:22 PM  Depression screen PHQ 2/9  Decreased Interest 0  Down, Depressed, Hopeless 0  PHQ - 2 Score 0    Scribe for Treatment Team: Louetta Wynona SILK 02/28/2024 2:09 PM

## 2024-02-28 NOTE — Plan of Care (Signed)
   Problem: Education: Goal: Knowledge of [place] [facility] Education information/materials will improve Outcome: Progressing Goal: Emotional status will improve Outcome: Progressing Goal: Mental status will improve Outcome: Progressing

## 2024-02-28 NOTE — Progress Notes (Signed)
 [facility] MD Progress Note  02/28/2024 9:41 PM Ann Avery  MRN:  [id number]  Reason for Admission: Ann Avery is a 29 y.o., female with history of ADHD and Crohn's disease admitted to [facility] due to suicidal ideation secondary to severe pain due to a Crohn's flare up. She was notably found to be flu positive. UDS + for THC, opiates, and amphetamines which are reportedly for anxiety, crohn's flare up, and ADHD respectively. PDMP reviewed.    Daily notes: Ann Avery was seen today in her room and remains on droplet precautions due to influenza A. She reports her mood as fine and denies current symptoms of anxiety or depression. She denies suicidal ideation, passive thoughts of death, self-harm urges, or homicidal ideation, as well as any psychotic symptoms. She reports poor appetite but states she has been maintaining fluid intake. She endorses mild difficulty falling asleep, which she attributes to being in a new environment, and notes that she does not typically have sleep issues at home. She denies current cold or flu symptoms, including sore throat, rhinorrhea, or congestion. She reports experiencing transient left-sided numbness last night, which she states typically occurs during Crohns disease flare-ups; she denies any numbness at the time of interview.  She reports that prior to admission she was experiencing significant pain related to a Crohns flare while showering and began having hallucinations, prompting her to call 911 for transport to the emergency department. She acknowledges making a suicidal statement at that time but states she did not mean it, explaining that it occurred in the context of feeling overwhelmed and fatigued by her chronic illness, which she has had since age 56. She is currently future oriented and shares positive anticipatory plans, including her upcoming birthday next week, which her mother and stepfather plan to celebrate. She also reports having a three-year-old daughter who is  currently with her father, the patients boyfriend, with whom she lives.  Ann Avery was wearing Avery scrubs and appeared pleasant, polite, and cooperative throughout the interview. Overall, she appears psychiatrically stable at this time. Anticipated discharge remains Tuesday, January 6, pending continued stabilization and absence of new concerns.  Collateral information was obtained from the patients mother, Ann Avery [phone]). She reports that the patient has never endorsed suicidal ideation in the past and has not demonstrated any behaviors suggesting she would be a safety risk to herself or others. The patients mother denies any acute safety concerns at this time. She expressed primary concern regarding the patients Crohns disease and management of her ADHD medication. She described the patient as a good mother to her three-year-old child, noting that the patient works and actively cares for her child. She also reported that her spouse Ann Avery), who is the patients stepfather, will pick the patient up at the time of discharge. The patients mother was informed of the anticipated discharge date of tomorrow, January 6, pending continued stabilization and absence of new concerns.   Principal Problem: Adjustment disorder with mixed anxiety and depressed mood Diagnosis: Principal Problem:   Adjustment disorder with mixed anxiety and depressed mood Active Problems:   Attention deficit hyperactivity disorder, predominantly inattentive type   Crohn's disease of intestine (HCC)   Total Time spent with patient: 30 minutes  Past Psychiatric History: See H&P  Past Medical History:  Past Medical History:  Diagnosis Date   Crohn disease (HCC)    Ulcerative colitis     Past Surgical History:  Procedure Laterality Date   CESAREAN SECTION     COLONOSCOPY  Family History:  Family History  Problem Relation Age of Onset   Hypertension Mother    Diabetes Father    Family  Psychiatric  History: See H&P Social History:  Social History   Substance and Sexual Activity  Alcohol Use No     Social History   Substance and Sexual Activity  Drug Use No    Social History   Socioeconomic History   Marital status: Single    Spouse name: Not on file   Number of children: Not on file   Years of education: Not on file   Highest education level: Not on file  Occupational History   Not on file  Tobacco Use   Smoking status: Never   Smokeless tobacco: Never  Vaping Use   Vaping status: Never Used  Substance and Sexual Activity   Alcohol use: No   Drug use: No   Sexual activity: Yes  Other Topics Concern   Not on file  Social History Narrative   Not on file   Social Drivers of Health   Tobacco Use: Low Risk (02/26/2024)   Patient History    Smoking Tobacco Use: Never    Smokeless Tobacco Use: Never    Passive Exposure: Not on file  Financial Resource Strain: Not on file  Food Insecurity: No Food Insecurity (02/26/2024)   Epic    Worried About Programme Researcher, Broadcasting/film/video in the Last Year: Never true    Ran Out of Food in the Last Year: Never true  Transportation Needs: No Transportation Needs (02/26/2024)   Epic    Lack of Transportation (Medical): No    Lack of Transportation (Non-Medical): No  Physical Activity: Not on file  Stress: Not on file  Social Connections: Not on file  Depression (EYV7-0): Not on file  Alcohol Screen: Low Risk (02/26/2024)   Alcohol Screen    Last Alcohol Screening Score (AUDIT): 0  Housing: Low Risk (02/26/2024)   Epic    Unable to Pay for Housing in the Last Year: No    Number of Times Moved in the Last Year: 0    Homeless in the Last Year: No  Utilities: Not At Risk (02/26/2024)   Epic    Threatened with loss of utilities: No  Health Literacy: Not on file   Additional Social History:     Current Medications: Current Facility-Administered Medications  Medication Dose Route Frequency Provider Last Rate Last Admin    acetaminophen  (TYLENOL ) tablet 650 mg  650 mg Oral Q6H PRN Ajibola, Ene A, NP   650 mg at 02/27/24 0702   alum & mag hydroxide-simeth (MAALOX/MYLANTA) 200-200-20 MG/5ML suspension 30 mL  30 mL Oral Q4H PRN Ajibola, Ene A, NP       haloperidol  (HALDOL ) tablet 5 mg  5 mg Oral TID PRN Ajibola, Ene A, NP       And   diphenhydrAMINE  (BENADRYL ) capsule 50 mg  50 mg Oral TID PRN Ajibola, Ene A, NP       haloperidol  lactate (HALDOL ) injection 5 mg  5 mg Intramuscular TID PRN Ajibola, Ene A, NP       And   diphenhydrAMINE  (BENADRYL ) injection 50 mg  50 mg Intramuscular TID PRN Ajibola, Ene A, NP       And   LORazepam  (ATIVAN ) injection 2 mg  2 mg Intramuscular TID PRN Ajibola, Ene A, NP       haloperidol  lactate (HALDOL ) injection 10 mg  10 mg Intramuscular TID PRN Ajibola, Ene A,  NP       And   diphenhydrAMINE  (BENADRYL ) injection 50 mg  50 mg Intramuscular TID PRN Ajibola, Ene A, NP       And   LORazepam  (ATIVAN ) injection 2 mg  2 mg Intramuscular TID PRN Ajibola, Ene A, NP       hydrOXYzine  (ATARAX ) tablet 25 mg  25 mg Oral TID PRN Ajibola, Ene A, NP   25 mg at 02/28/24 2124   magnesium  hydroxide (MILK OF MAGNESIA) suspension 30 mL  30 mL Oral Daily PRN Ajibola, Ene A, NP       menthol  (CEPACOL) lozenge 3 mg  1 lozenge Oral PRN Lynnette Barter, MD       ondansetron  (ZOFRAN ) tablet 4 mg  4 mg Oral Q8H PRN Lynnette Barter, MD       oseltamivir  (TAMIFLU ) capsule 75 mg  75 mg Oral Q12H Ji, Andrew, MD   75 mg at 02/28/24 2124   sodium chloride  (OCEAN) 0.65 % nasal spray 1 spray  1 spray Each Nare PRN Lynnette Barter, MD       traZODone  (DESYREL ) tablet 50 mg  50 mg Oral QHS PRN Ajibola, Ene A, NP   50 mg at 02/28/24 2124    Lab Results:  No results found for this or any previous visit (from the past 48 hours).   Blood Alcohol level:  Lab Results  Component Value Date   [facility] <15 02/25/2024   ETH <10 11/25/2021    Metabolic Disorder Labs: No results found for: HGBA1C, MPG No results found for:  PROLACTIN No results found for: CHOL, TRIG, HDL, CHOLHDL, VLDL, LDLCALC  Physical Findings: AIMS:  ,  ,  ,  ,  ,  ,   CIWA:    COWS:     Musculoskeletal: Strength & Muscle Tone: within normal limits Gait & Station: normal Patient leans: N/A  Psychiatric Specialty Exam:  Presentation  General Appearance:  Appropriate for Environment (Wearing hosptial scrubs)  Eye Contact: Good  Speech: Clear and Coherent; Normal Rate  Speech Volume: Normal  Handedness: Right   Mood and Affect  Mood: Euthymic (Fine)  Affect: Appropriate; Congruent   Thought Process  Thought Processes: Coherent; Linear  Descriptions of Associations:Intact  Orientation:Full (Time, Place and Person)  Thought Content:Logical  History of Schizophrenia/Schizoaffective disorder:No data recorded Duration of Psychotic Symptoms:No data recorded Hallucinations:Hallucinations: None  Ideas of Reference:None  Suicidal Thoughts:Suicidal Thoughts: No  Homicidal Thoughts:Homicidal Thoughts: No   Sensorium  Memory: Immediate Good; Recent Good; Remote Good  Judgment: Intact  Insight: Present   Executive Functions  Concentration: Good  Attention Span: Good  Recall: Good  Fund of Knowledge: Good  Language: Good   Psychomotor Activity  Psychomotor Activity: Psychomotor Activity: Normal   Assets  Assets: Communication Skills; Desire for Improvement; Financial Resources/Insurance; Housing; Leisure Time; Resilience; Social Support; Talents/Skills   Sleep  Sleep: Sleep: Good    Physical Exam: Physical Exam Vitals and nursing note reviewed.  HENT:     Head: Normocephalic and atraumatic.     Mouth/Throat:     Pharynx: Oropharynx is clear.  Pulmonary:     Effort: No respiratory distress.  Neurological:     Mental Status: She is alert and oriented to person, place, and time.    ROS Blood pressure 119/77, pulse (!) 119, temperature 98.5 F (36.9  C), temperature source Oral, resp. rate 16, height 5' 7 (1.702 m), weight 64.1 kg, last menstrual period 02/25/2024, SpO2 99%. Body mass index is 22.15 kg/m.  Treatment Plan Summary: Daily contact with patient to assess and evaluate symptoms and progress in treatment and Medication management   PLAN: Safety and Monitoring:             -- Voluntary admission to inpatient psychiatric unit for safety, stabilization and treatment             -- Daily contact with patient to assess and evaluate symptoms and progress in treatment             -- Patient's case to be discussed in multi-disciplinary team meeting             -- Observation Level : q15 minute checks             -- Vital signs: q12 hours             -- Precautions: suicide, elopement, and assault   2. Psychiatric Problems Adjustment disorder with depressed mood and anxiety ADHD predominantly inattentive type -Advise outpatient psychotherapy - Holding home medications Vyvanse, Intuniv , and Focalin - Hydroxyzine  25 mg 3 times daily as needed for anxiety - Trazodone  50 mg nightly as needed for insomnia   -PRNs: maalox, milk of magnesia, hydroxyzine , trazodone  -- As needed agitation protocol in-place   The risks/benefits/side-effects/alternatives to the above medication were discussed in detail with the patient and time was given for questions. The patient consents to medication trial. FDA black box warnings, if present, were discussed.   The patient is agreeable with the medication plan, as above. We will monitor the patient's response to pharmacologic treatment, and adjust medications as necessary.   3. Medical Problems Crohn's disease - Continue to monitor   Influenza A - Tamiflu  75 mg twice daily for 5 days   4. Routine and other pertinent labs: TSH, vitamin D ordered   5. Group Therapy:             -- Encouraged patient to participate in unit milieu and in scheduled group therapies              -- Short Term  Goals: Ability to identify changes in lifestyle to reduce recurrence of condition, verbalize feelings, identify and develop effective coping behaviors, maintain clinical measurements within normal limits, and identify triggers associated with substance abuse/mental health issues will improve. Improvement in ability to demonstrate self-control and comply with prescribed medications.             -- Long Term Goals: Improvement in symptoms so as ready for discharge -- Patient is encouraged to participate in group therapy while admitted to the psychiatric unit. -- We will address other chronic and acute stressors, which contributed to the patient's Adjustment disorder with mixed anxiety and depressed mood in order to reduce the risk of self-harm at discharge.   6. Discharge Planning:              -- Social work and case management to assist with discharge planning and identification of [facility] follow-up needs prior to discharge             -- Estimated LOS: 5-7 days             -- Discharge Concerns: Need to establish a safety plan; Medication compliance and effectiveness             -- Discharge Goals: Return home with outpatient referrals for mental health follow-up including medication management/psychotherapy   I certify that inpatient services furnished can reasonably be expected to improve the patient's condition.  Blair Chiquita Hint, NP 02/28/2024, 9:41 PM

## 2024-02-29 ENCOUNTER — Encounter (HOSPITAL_COMMUNITY): Payer: Self-pay | Admitting: Psychiatry

## 2024-02-29 MED ORDER — HYDROXYZINE HCL 25 MG PO TABS: 25.0000 mg | ORAL_TABLET | Freq: Three times a day (TID) | ORAL | 0 refills | Status: AC | PRN

## 2024-02-29 MED ORDER — MENTHOL 3 MG MT LOZG: 1.0000 | LOZENGE | OROMUCOSAL | Status: AC | PRN

## 2024-02-29 MED ORDER — OSELTAMIVIR PHOSPHATE 75 MG PO CAPS: 75.0000 mg | ORAL_CAPSULE | Freq: Two times a day (BID) | ORAL | 0 refills | Status: AC

## 2024-02-29 NOTE — Discharge Summary (Signed)
 " Physician Discharge Summary Note  Patient:  Ann Avery is an 29 y.o., female MRN:  [id number] DOB:  [birth year] Patient phone:  [phone] (home)  Patient address:   [address] [place] [address],  Total Time spent with patient: 30 minutes  Date of Admission:  02/26/2024 Date of Discharge: 02/29/2024   Reason for Admission:  Reason for Admission: Ann Avery is a 29 y.o., female with history of ADHD and Crohn's disease admitted to [facility] due to suicidal ideation secondary to severe pain due to a Crohn's flare up. She was notably found to be flu positive. UDS + for THC, opiates, and amphetamines which are reportedly for anxiety, crohn's flare up, and ADHD respectively. PDMP reviewed.   Ann Avery is planned for discharge home where she resided with her partner and 28-year-old daughter. She has been referred for outpatient therapy services. She is future oriented and reports that she is looking forward to celebrating her upcoming birthday with family next week.  She denies any current suicidal ideation, intent, or plan and homicidal ideation at the time of discharge.  She is aware of follow-up plans and demonstrates insight into the importance of ongoing treatment. No current safety concerns or acute psychiatric symptoms observed.   Principal Problem: Adjustment disorder with mixed anxiety and depressed mood Discharge Diagnoses: Principal Problem:   Adjustment disorder with mixed anxiety and depressed mood Active Problems:   Attention deficit hyperactivity disorder, predominantly inattentive type   Crohn's disease of intestine (HCC)   Past Psychiatric History:  Previous psych diagnoses: ADHD Prior inpatient psychiatric treatment: denies Prior outpatient psychiatric treatment: denies Current psychiatric provider: none Current therapist:none   History of suicide attempts: none History of homicide: none   Past Psychotropics: vyvanse, focalin, intuniv    Substance  Use History: Alcohol: never drinks Tobacco: denies Illicit Substance: denies Cannabis: THC vape  Past Medical History:  Past Medical History:  Diagnosis Date   Crohn disease (HCC)    Ulcerative colitis     Past Surgical History:  Procedure Laterality Date   CESAREAN SECTION     COLONOSCOPY     Family History:  Family History  Problem Relation Age of Onset   Hypertension Mother    Diabetes Father    Family Psychiatric  History: See H&P Social History:  Social History   Substance and Sexual Activity  Alcohol Use No     Social History   Substance and Sexual Activity  Drug Use No    Social History   Socioeconomic History   Marital status: Single    Spouse name: Not on file   Number of children: Not on file   Years of education: Not on file   Highest education level: Not on file  Occupational History   Not on file  Tobacco Use   Smoking status: Never   Smokeless tobacco: Never  Vaping Use   Vaping status: Never Used  Substance and Sexual Activity   Alcohol use: No   Drug use: No   Sexual activity: Yes  Other Topics Concern   Not on file  Social History Narrative   Not on file   Social Drivers of Health   Tobacco Use: Low Risk (02/26/2024)   Patient History    Smoking Tobacco Use: Never    Smokeless Tobacco Use: Never    Passive Exposure: Not on file  Financial Resource Strain: Not on file  Food Insecurity: No Food Insecurity (02/26/2024)   Epic    Worried About Running  Out of Food in the Last Year: Never true    Ran Out of Food in the Last Year: Never true  Transportation Needs: No Transportation Needs (02/26/2024)   Epic    Lack of Transportation (Medical): No    Lack of Transportation (Non-Medical): No  Physical Activity: Not on file  Stress: Not on file  Social Connections: Not on file  Depression (EYV7-0): Not on file  Alcohol Screen: Low Risk (02/26/2024)   Alcohol Screen    Last Alcohol Screening Score (AUDIT): 0  Housing: Low Risk (02/26/2024)    Epic    Unable to Pay for Housing in the Last Year: No    Number of Times Moved in the Last Year: 0    Homeless in the Last Year: No  Utilities: Not At Risk (02/26/2024)   Epic    Threatened with loss of utilities: No  Health Literacy: Not on file    Hospital Course:  During the patient's hospitalization, patient had extensive initial psychiatric evaluation, and follow-up psychiatric evaluations every day.  Psychiatric diagnoses provided upon initial assessment:  Principal Problem:   Adjustment disorder with mixed anxiety and depressed mood Active Problems:   Attention deficit hyperactivity disorder, predominantly inattentive type   Crohn's disease of intestine (HCC)  Patient's psychiatric medications were adjusted on admission:  - Hydroxyzine  25 mg 3 times daily as needed for anxiety - Trazodone  50 mg nightly as needed for insomnia  During the hospitalization, NO other adjustments were made to the patient's psychiatric medication regimen:   Patient's care was discussed during the interdisciplinary team meeting every day during the hospitalization. Gradually, patient started adjusting to milieu. The patient was evaluated each day by a clinical provider to ascertain response to treatment. Patient was asked each day to complete a self inventory noting mood, mental status, pain, new symptoms, anxiety and concerns. Symptoms were reported as significantly decreased or resolved completely by discharge.   On day of discharge, the patient reports that their mood is stable. The patient denied having suicidal thoughts for more than 48 hours prior to discharge.  Patient denies having homicidal thoughts.  Patient denies having auditory hallucinations.  Patient denies any visual hallucinations or other symptoms of psychosis. The patient was motivated to continue taking medication with a goal of continued improvement in mental health.   Supportive psychotherapy was provided to the patient. Coping  skills, problem solving as well as relaxation therapies were also part of the unit programming.  Labs were reviewed with the patient, and abnormal results were discussed with the patient.  The patient is able to verbalize their individual safety plan to this provider.  # It is recommended to the patient to continue psychiatric medications as prescribed, after discharge from the hospital.    # It is recommended to the patient to follow up with your outpatient psychiatric provider and PCP.  # It was discussed with the patient, the impact of alcohol, drugs, tobacco have been there overall psychiatric and medical wellbeing, and total abstinence from substance use was recommended the patient.ed.  # Prescriptions provided or sent directly to preferred pharmacy at discharge. Patient agreeable to plan. Given opportunity to ask questions. Appears to feel comfortable with discharge.    # In the event of worsening symptoms, the patient is instructed to call the crisis hotline, 911 and or go to the nearest ED for appropriate evaluation and treatment of symptoms. To follow-up with primary care provider for other medical issues, concerns and or health care needs  #  Patient was discharged home with a plan to follow up as noted below.   Physical Findings: AIMS:  , , N/A  ,  ,  ,  ,   CIWA:   N/A COWS:   N/A  Musculoskeletal: Strength & Muscle Tone: within normal limits Gait & Station: normal Patient leans: N/A   Psychiatric Specialty Exam:  Presentation  General Appearance:  Appropriate for Environment (Wearing hosptial scrubs)  Eye Contact: Good  Speech: Clear and Coherent; Normal Rate  Speech Volume: Normal  Handedness: Right   Mood and Affect  Mood: Euthymic (Fine)  Affect: Appropriate; Congruent   Thought Process  Thought Processes: Coherent; Linear  Descriptions of Associations:Intact  Orientation:Full (Time, Place and Person)  Thought Content:Logical  History  of Schizophrenia/Schizoaffective disorder:No data recorded Duration of Psychotic Symptoms:No data recorded Hallucinations:Hallucinations: None  Ideas of Reference:None  Suicidal Thoughts:Suicidal Thoughts: No  Homicidal Thoughts:Homicidal Thoughts: No   Sensorium  Memory: Immediate Good; Recent Good; Remote Good  Judgment: Intact  Insight: Present   Executive Functions  Concentration: Good  Attention Span: Good  Recall: Good  Fund of Knowledge: Good  Language: Good   Psychomotor Activity  Psychomotor Activity: Psychomotor Activity: Normal   Assets  Assets: Communication Skills; Desire for Improvement; Financial Resources/Insurance; Housing; Leisure Time; Resilience; Social Support; Talents/Skills   Sleep  Sleep: Sleep: Good  Estimated Sleeping Duration (Last 24 Hours): 5.00-5.75 hours   Physical Exam: Physical Exam Vitals and nursing note reviewed.  Constitutional:      General: She is not in acute distress.    Appearance: She is normal weight. She is not ill-appearing.  HENT:     Head: Normocephalic and atraumatic.     Mouth/Throat:     Pharynx: Oropharynx is clear.  Pulmonary:     Effort: No respiratory distress.  Skin:    General: Skin is dry.  Neurological:     Mental Status: She is alert and oriented to person, place, and time. Mental status is at baseline.  Psychiatric:        Mood and Affect: Mood normal.        Behavior: Behavior normal.        Thought Content: Thought content normal.        Judgment: Judgment normal.    Review of Systems  Psychiatric/Behavioral:  Positive for substance abuse (UDS + THC). Negative for depression, hallucinations, memory loss and suicidal ideas. The patient is not nervous/anxious and does not have insomnia.        Stable for lower level of care  All other systems reviewed and are negative.  Blood pressure 119/77, pulse (!) 119, temperature 98.5 F (36.9 C), temperature source Oral, resp. rate  16, height 5' 7 (1.702 m), weight 64.1 kg, last menstrual period 02/25/2024, SpO2 99%. Body mass index is 22.15 kg/m.   Tobacco Use History[1] Tobacco Cessation:  N/A, patient does not currently use tobacco products   Blood Alcohol level:  Lab Results  Component Value Date   [facility] <15 02/25/2024   ETH <10 11/25/2021    Metabolic Disorder Labs:  No results found for: HGBA1C, MPG No results found for: PROLACTIN No results found for: CHOL, TRIG, HDL, CHOLHDL, VLDL, LDLCALC  See Psychiatric Specialty Exam and Suicide Risk Assessment completed by Attending Physician prior to discharge.  Discharge destination:  Home  Is patient on multiple antipsychotic therapies at discharge:  No   Has Patient had three or more failed trials of antipsychotic monotherapy by history:  No  Recommended  Plan for Multiple Antipsychotic Therapies: NA  Discharge Instructions     Activity as tolerated - No restrictions   Complete by: As directed    Call MD for:  temperature >100.4   Complete by: As directed       Allergies as of 02/29/2024       Reactions   Latex Hives, Itching, Rash   Shellfish Allergy Anaphylaxis, Hives, Rash   Other Reaction(s): Not available   Shellfish Protein-containing Drug Products Anaphylaxis, Hives, Rash   Bee Venom Hives        Medication List     STOP taking these medications    dexmethylphenidate 2.5 MG tablet Commonly known as: FOCALIN   Intuniv  4 MG Tb24 ER tablet Generic drug: guanFACINE    Pentasa 500 MG CR capsule Generic drug: mesalamine   Vyvanse 60 MG capsule Generic drug: lisdexamfetamine       TAKE these medications      Indication  betamethasone dipropionate 0.05 % ointment Commonly known as: DIPROLENE Apply 1 application  topically 2 (two) times daily as needed (exzema x 2 wk).  Indication: Eczema   hydrOXYzine  25 MG tablet Commonly known as: ATARAX  Take 1 tablet (25 mg total) by mouth 3 (three) times daily as  needed for anxiety.  Indication: Feeling Anxious   menthol  3 MG lozenge Commonly known as: CEPACOL Take 1 lozenge (3 mg total) by mouth as needed for sore throat.  Indication: Sore Throat   oseltamivir  75 MG capsule Commonly known as: TAMIFLU  Take 1 capsule (75 mg total) by mouth every 12 (twelve) hours for 5 days.  Indication: Influenza A Virus Infection        Follow-up Information     [facility] Follow up on 03/06/2024.   Why: You have a hospital follow up appointment for therapy and medication management services on  03/06/24 at 3:30 pm.  The appointment will be Virtual, telehealth. Contact information: [address]  [address] [place] [address] [phone]                 Follow-up recommendations:   Activity: as tolerated  Diet: heart healthy  Other: -Follow-up with your outpatient psychiatric provider -instructions on appointment date, time, and address (location) are provided to you in discharge paperwork.  -Take your psychiatric medications as prescribed at discharge - instructions are provided to you in the discharge paperwork  -Follow-up with outpatient primary care doctor and other specialists -for management of preventative medicine and chronic medical disease: Crohn's  Hemoglobin 10.7  -Testing: Follow-up with outpatient provider for abnormal lab results: Influenza A  -If you are prescribed an atypical antipsychotic medication, we recommend that your outpatient psychiatrist follow routine screening for side effects within 3 months of discharge, including monitoring: AIMS scale, height, weight, blood pressure, fasting lipid panel, HbA1c, and fasting blood sugar.   -Recommend total abstinence from alcohol, tobacco, and other illicit drug use at discharge.   -If your psychiatric symptoms recur, worsen, or if you have side effects to your psychiatric medications, call your outpatient psychiatric provider, 911, 988 or go to the nearest emergency  department.  -If suicidal thoughts occur, immediately call your outpatient psychiatric provider, 911, [phone] or go to the nearest emergency department.   Comments: Follow all discharge instructions as recommended  Signed: Blair Chiquita Hint, NP 02/29/2024, 9:23 AM           [1]  Social History Tobacco Use  Smoking Status Never  Smokeless Tobacco Never   "

## 2024-02-29 NOTE — [facility] Suicide Risk Assessment (Signed)
 Suicide Risk Assessment  Discharge Assessment    [facility] Discharge Suicide Risk Assessment   Principal Problem: Adjustment disorder with mixed anxiety and depressed mood Discharge Diagnoses: Principal Problem:   Adjustment disorder with mixed anxiety and depressed mood Active Problems:   Attention deficit hyperactivity disorder, predominantly inattentive type   Crohn's disease of intestine (HCC)   Reason for Admission: Ann Avery is a 29 y.o., female with history of ADHD and Crohn's disease admitted to [facility] due to suicidal ideation secondary to severe pain due to a Crohn's flare up. She was notably found to be flu positive. UDS + for THC, opiates, and amphetamines which are reportedly for anxiety, crohn's flare up, and ADHD respectively. PDMP reviewed.    Total Time spent with patient: 30 minutes  Musculoskeletal: Strength & Muscle Tone: within normal limits Gait & Station: normal Patient leans: N/A  Psychiatric Specialty Exam  Presentation  General Appearance:  Appropriate for Environment (Wearing hosptial scrubs)  Eye Contact: Good  Speech: Clear and Coherent; Normal Rate  Speech Volume: Normal  Handedness: Right   Mood and Affect  Mood: Euthymic (Fine)  Duration of Depression Symptoms: No data recorded Affect: Appropriate; Congruent   Thought Process  Thought Processes: Coherent; Linear  Descriptions of Associations:Intact  Orientation:Full (Time, Place and Person)  Thought Content:Logical  History of Schizophrenia/Schizoaffective disorder:No data recorded Duration of Psychotic Symptoms:No data recorded Hallucinations:Hallucinations: None  Ideas of Reference:None  Suicidal Thoughts:Suicidal Thoughts: No  Homicidal Thoughts:Homicidal Thoughts: No   Sensorium  Memory: Immediate Good; Recent Good; Remote Good  Judgment: Intact  Insight: Present   Executive Functions  Concentration: Good  Attention  Span: Good  Recall: Good  Fund of Knowledge: Good  Language: Good   Psychomotor Activity  Psychomotor Activity: Psychomotor Activity: Normal   Assets  Assets: Communication Skills; Desire for Improvement; Financial Resources/Insurance; Housing; Leisure Time; Resilience; Social Support; Talents/Skills   Sleep  Sleep: Sleep: Good  Estimated Sleeping Duration (Last 24 Hours): 5.00-5.75 hours  Physical Exam: Physical Exam ROS Blood pressure 119/77, pulse (!) 119, temperature 98.5 F (36.9 C), temperature source Oral, resp. rate 16, height 5' 7 (1.702 m), weight 64.1 kg, last menstrual period 02/25/2024, SpO2 99%. Body mass index is 22.15 kg/m.  Mental Status Per Nursing Assessment::   On Admission:  NA  Demographic Factors:  Adolescent or young adult  Loss Factors: NA  Historical Factors: Impulsivity  Risk Reduction Factors:   Responsible for children under 32 years of age, Sense of responsibility to family, Religious beliefs about death, Employed, Living with another person, especially a relative, Positive social support, Positive therapeutic relationship, and Positive coping skills or problem solving skills  Continued Clinical Symptoms:  Medical Diagnoses and Treatments/Surgeries  Cognitive Features That Contribute To Risk:  None    Suicide Risk:  No current suicidal thoughts.  No current homicidal thoughts.  No current thoughts of violence.  Modifiable risk factor targeted during this admission as mood symptoms.  Patient is stable.  No evidence of mania.  No evidence of psychosis.  No evidence of dissociation.  No new psychosocial stressor. Patient is stable for care at the lower setting.    Follow-up Information     [facility] Follow up on 03/06/2024.   Why: You have a hospital follow up appointment for therapy and medication management services on  03/06/24 at 3:30 pm.  The appointment will be Virtual, telehealth. Contact information: [address]   [address] [place] [address] [phone]  Plan Of Care/Follow-up recommendations:  See discharge summary.  Blair Chiquita Hint, NP 02/29/2024, 9:20 AM

## 2024-02-29 NOTE — Group Note (Signed)
 Date:  02/29/2024 Time:  10:09 AM  Group Topic/Focus: Pet therapy Pet therapy, also known as animal-assisted therapy, is a supportive approach used to improve mental health in adults by incorporating trained animals into therapeutic settings. Interaction with animals can help reduce stress, anxiety, and feelings of loneliness while promoting relaxation and emotional comfort. Pet therapy has been shown to improve mood, encourage social interaction, and increase engagement in treatment, especially for individuals experiencing depression, anxiety, PTSD, or chronic stress. When guided by a trained professional and used alongside traditional mental health care, pet therapy can be a safe and effective way to support emotional well-being and overall mental health.    Participation Level:  Did Not Attend   Ann Avery 02/29/2024, 10:09 AM

## 2024-02-29 NOTE — [facility] Suicide Risk Assessment (Signed)
 [facility] INPATIENT:  Family/Significant Other Suicide Prevention Education  Suicide Prevention Education:  Education Completed; Loni Davis (significant other) [phone],  (name of family member/significant other) has been identified by the patient as the family member/significant -other with whom the patient will be residing, and identified as the person(s) who will aid the patient in the event of a mental health crisis (suicidal ideations/suicide attempt).  With written consent from the patient, the family member/significant other has been provided the following suicide prevention education, prior to the and/or following the discharge of the patient.  The suicide prevention education provided includes the following: Suicide risk factors Suicide prevention and interventions National Suicide Hotline telephone number [facility] assessment telephone number [facility] Emergency Assistance 911 [facility] and/or Residential Mobile Crisis Unit telephone number  Request made of family/significant other to: Remove weapons (e.g., guns, rifles, knives), all items previously/currently identified as safety concern.   Remove drugs/medications (over-the-counter, prescriptions, illicit drugs), all items previously/currently identified as a safety concern.  Loni confirmed that any weapons including his firearm are locked up safely away from the patient's access. Loni agrees to the requests made above and knows who to contact in the event of an emergency. Loni has no concerns regarding patient's discharge.   The family member/significant other verbalizes understanding of the suicide prevention education information provided.  The family member/significant other agrees to remove the items of safety concern listed above.  Ann Avery 02/29/2024, 9:32 AM

## 2024-02-29 NOTE — Progress Notes (Signed)
 Patient discharged off unit at 1010. Patient belongings reviewed and acknowledged by patient. AVS and Transition Record reviewed and acknowledged by patient. Safety plan completed by patient, reviewed by nurse with patient and copy provided. Any medications and or prescriptions necessary for discharge addressed and provided to patient. Patient denies SI, plan or intent. Denies HI. Denies AVH. No observed or reported side effects to medication. No observed or reported agitation, aggression, or other acute emotional distress. No reported or observed physical abnormalities or concerns. Patient transportation from facility verified and observed.

## 2024-02-29 NOTE — Group Note (Signed)
 Date:  02/29/2024 Time:  9:50 AM  Group Topic/Focus: Goals and orientation Goals Group:   The focus of this group is to help patients establish daily goals to achieve during treatment and discuss how the patient can incorporate goal setting into their daily lives to aide in recovery. Orientation:   The focus of this group is to educate the patient on the purpose and policies of crisis stabilization and provide a format to answer questions about their admission.  The group details unit policies and expectations of patients while admitted.    Participation Level:  Did Not Attend   Dolores CHRISTELLA Fredericks 02/29/2024, 9:50 AM

## 2024-02-29 NOTE — Progress Notes (Signed)
" °  [facility] Adult Case Management Discharge Plan :  Will you be returning to the same living situation after discharge:  Yes,  patient will be returning home to address on file. At discharge, do you have transportation home?: Yes,  patient's stepfather, Alm Larve, will be providing transportation at 10am.  Do you have the ability to pay for your medications: Yes,  patient has active health insurance.  Release of information consent forms completed and in the chart;  Patient's signature needed at discharge.  Patient to Follow up at:  Follow-up Information     [facility] Follow up on 03/06/2024.   Why: You have a hospital follow up appointment for therapy and medication management services on  03/06/24 at 3:30 pm.  The appointment will be Virtual, telehealth. Contact information: [address]  [address] [place] [address] [phone]                 Next level of care provider has access to [facility] Link:no  Safety Planning and Suicide Prevention discussed: Yes,  completed with Loni Davis (significant other) [phone].     Has patient been referred to the Quitline?: Patient does not use tobacco/nicotine products  Patient has been referred for addiction treatment: No known substance use disorder.  Louetta Lame, LCSWA 02/29/2024, 9:34 AM "

## 2024-03-01 ENCOUNTER — Emergency Department (HOSPITAL_BASED_OUTPATIENT_CLINIC_OR_DEPARTMENT_OTHER)
Admission: EM | Admit: 2024-03-01 | Discharge: 2024-03-01 | Discharge status: Against Medical Advice | Attending: Emergency Medicine | Admitting: Emergency Medicine

## 2024-03-01 ENCOUNTER — Other Ambulatory Visit: Payer: Self-pay

## 2024-03-01 ENCOUNTER — Encounter (HOSPITAL_BASED_OUTPATIENT_CLINIC_OR_DEPARTMENT_OTHER): Payer: Self-pay | Admitting: Emergency Medicine

## 2024-03-01 DIAGNOSIS — Z5321 Procedure and treatment not carried out due to patient leaving prior to being seen by health care provider: Secondary | ICD-10-CM | POA: Diagnosis not present

## 2024-03-01 DIAGNOSIS — K625 Hemorrhage of anus and rectum: Secondary | ICD-10-CM | POA: Insufficient documentation

## 2024-03-01 LAB — COMPREHENSIVE METABOLIC PANEL WITH GFR
ALT: 11 U/L (ref 0–44)
AST: 34 U/L (ref 15–41)
Albumin: 4.3 g/dL (ref 3.5–5.0)
Alkaline Phosphatase: 69 U/L (ref 38–126)
Anion gap: 14 (ref 5–15)
BUN: <5 mg/dL — ABNORMAL LOW (ref 6–20)
CO2: 25 mmol/L (ref 22–32)
Calcium: 9.4 mg/dL (ref 8.9–10.3)
Chloride: 100 mmol/L (ref 98–111)
Creatinine, Ser: 0.86 mg/dL (ref 0.44–1.00)
GFR, Estimated: >60 mL/min
Glucose, Bld: 102 mg/dL — ABNORMAL HIGH (ref 70–99)
Potassium: 3.2 mmol/L — ABNORMAL LOW (ref 3.5–5.1)
Sodium: 139 mmol/L (ref 135–145)
Total Bilirubin: 0.4 mg/dL (ref 0.0–1.2)
Total Protein: 8.5 g/dL — ABNORMAL HIGH (ref 6.5–8.1)

## 2024-03-01 LAB — CBC
HCT: 34.6 % — ABNORMAL LOW (ref 36.0–46.0)
Hemoglobin: 11 g/dL — ABNORMAL LOW (ref 12.0–15.0)
MCH: 24.3 pg — ABNORMAL LOW (ref 26.0–34.0)
MCHC: 31.8 g/dL (ref 30.0–36.0)
MCV: 76.4 fL — ABNORMAL LOW (ref 80.0–100.0)
Platelets: 393 K/uL (ref 150–400)
RBC: 4.53 MIL/uL (ref 3.87–5.11)
RDW: 17.4 % — ABNORMAL HIGH (ref 11.5–15.5)
WBC: 4.4 K/uL (ref 4.0–10.5)
nRBC: 0 % (ref 0.0–0.2)

## 2024-03-01 NOTE — ED Triage Notes (Signed)
 Pt via pov from home with stepfather; c/o rectal bleeding today. Pt states she was in the hospital for 4 days and got out yesterday. She states she has crohns and was not given her meds during the hospitalization. Pt states she is defecating straight blood. Pt a&o x 4; nad noted.
